# Patient Record
Sex: Male | Born: 1995 | Hispanic: No | Marital: Single | Smoking: Current every day smoker
Health system: Southern US, Community
[De-identification: ages and names within clinical notes are randomized; demographics above are authoritative.]

## PROBLEM LIST (undated history)

## (undated) DIAGNOSIS — E039 Hypothyroidism, unspecified: Secondary | ICD-10-CM

## (undated) DIAGNOSIS — E119 Type 2 diabetes mellitus without complications: Secondary | ICD-10-CM

## (undated) HISTORY — DX: Hypothyroidism, unspecified: E03.9

## (undated) HISTORY — DX: Type 2 diabetes mellitus without complications: E11.9

---
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
## 2017-11-17 ENCOUNTER — Other Ambulatory Visit: Payer: Self-pay | Admitting: Family

## 2019-12-26 ENCOUNTER — Inpatient Hospital Stay (HOSPITAL_COMMUNITY)
Admission: EM | Admit: 2019-12-26 | Discharge: 2019-12-29 | DRG: 919 | Disposition: A | Discharge status: Home / Self Care | Payer: 59 | Attending: Internal Medicine | Admitting: Internal Medicine

## 2019-12-26 ENCOUNTER — Encounter (HOSPITAL_COMMUNITY): Payer: Self-pay

## 2019-12-26 ENCOUNTER — Other Ambulatory Visit: Payer: Self-pay

## 2019-12-26 DIAGNOSIS — Z8249 Family history of ischemic heart disease and other diseases of the circulatory system: Secondary | ICD-10-CM

## 2019-12-26 DIAGNOSIS — Z833 Family history of diabetes mellitus: Secondary | ICD-10-CM

## 2019-12-26 DIAGNOSIS — T85694A Other mechanical complication of insulin pump, initial encounter: Secondary | ICD-10-CM | POA: Diagnosis not present

## 2019-12-26 DIAGNOSIS — E1010 Type 1 diabetes mellitus with ketoacidosis without coma: Secondary | ICD-10-CM | POA: Diagnosis present

## 2019-12-26 DIAGNOSIS — E063 Autoimmune thyroiditis: Secondary | ICD-10-CM | POA: Diagnosis present

## 2019-12-26 DIAGNOSIS — Z79890 Hormone replacement therapy (postmenopausal): Secondary | ICD-10-CM

## 2019-12-26 DIAGNOSIS — Y742 Prosthetic and other implants, materials and accessory general hospital and personal-use devices associated with adverse incidents: Secondary | ICD-10-CM | POA: Diagnosis present

## 2019-12-26 DIAGNOSIS — Z20822 Contact with and (suspected) exposure to covid-19: Secondary | ICD-10-CM | POA: Diagnosis present

## 2019-12-26 DIAGNOSIS — Z79891 Long term (current) use of opiate analgesic: Secondary | ICD-10-CM

## 2019-12-26 DIAGNOSIS — E1110 Type 2 diabetes mellitus with ketoacidosis without coma: Secondary | ICD-10-CM | POA: Diagnosis present

## 2019-12-26 DIAGNOSIS — Z9119 Patient's noncompliance with other medical treatment and regimen: Secondary | ICD-10-CM

## 2019-12-26 DIAGNOSIS — Z794 Long term (current) use of insulin: Secondary | ICD-10-CM

## 2019-12-26 DIAGNOSIS — Z791 Long term (current) use of non-steroidal anti-inflammatories (NSAID): Secondary | ICD-10-CM

## 2019-12-26 DIAGNOSIS — E875 Hyperkalemia: Secondary | ICD-10-CM | POA: Diagnosis present

## 2019-12-26 DIAGNOSIS — Z79899 Other long term (current) drug therapy: Secondary | ICD-10-CM

## 2019-12-26 DIAGNOSIS — K219 Gastro-esophageal reflux disease without esophagitis: Secondary | ICD-10-CM | POA: Diagnosis present

## 2019-12-26 DIAGNOSIS — R778 Other specified abnormalities of plasma proteins: Secondary | ICD-10-CM | POA: Diagnosis not present

## 2019-12-26 LAB — CBG MONITORING, ED
Glucose-Capillary: >600 mg/dL (ref 70–99)
Glucose-Capillary: >600 mg/dL (ref 70–99)

## 2019-12-26 LAB — COMPREHENSIVE METABOLIC PANEL
ALT: 44 U/L (ref 0–44)
AST: 66 U/L — ABNORMAL HIGH (ref 15–41)
Albumin: 5.3 g/dL — ABNORMAL HIGH (ref 3.5–5.0)
Alkaline Phosphatase: 100 U/L (ref 38–126)
BUN: 39 mg/dL — ABNORMAL HIGH (ref 6–20)
CO2: <7 mmol/L — ABNORMAL LOW (ref 22–32)
Calcium: 9 mg/dL (ref 8.9–10.3)
Chloride: 81 mmol/L — ABNORMAL LOW (ref 98–111)
Creatinine, Ser: 1.86 mg/dL — ABNORMAL HIGH (ref 0.61–1.24)
GFR calc Af Amer: 58 mL/min — ABNORMAL LOW (ref >60)
GFR calc non Af Amer: 50 mL/min — ABNORMAL LOW (ref >60)
Glucose, Bld: 724 mg/dL (ref 70–99)
Potassium: 5.3 mmol/L — ABNORMAL HIGH (ref 3.5–5.1)
Sodium: 120 mmol/L — ABNORMAL LOW (ref 135–145)
Total Bilirubin: 1.9 mg/dL — ABNORMAL HIGH (ref 0.3–1.2)
Total Protein: 9.9 g/dL — ABNORMAL HIGH (ref 6.5–8.1)

## 2019-12-26 LAB — I-STAT CHEM 8, ED
BUN: 58 mg/dL — ABNORMAL HIGH (ref 6–20)
Calcium, Ion: 1.08 mmol/L — ABNORMAL LOW (ref 1.15–1.40)
Chloride: 96 mmol/L — ABNORMAL LOW (ref 98–111)
Creatinine, Ser: 1.5 mg/dL — ABNORMAL HIGH (ref 0.61–1.24)
Glucose, Bld: >700 mg/dL (ref 70–99)
HCT: 51 % (ref 39.0–52.0)
Hemoglobin: 17.3 g/dL — ABNORMAL HIGH (ref 13.0–17.0)
Potassium: 6.2 mmol/L — ABNORMAL HIGH (ref 3.5–5.1)
Sodium: 120 mmol/L — ABNORMAL LOW (ref 135–145)
TCO2: 6 mmol/L — ABNORMAL LOW (ref 22–32)

## 2019-12-26 LAB — CBC WITH DIFFERENTIAL/PLATELET
Abs Immature Granulocytes: 0 10*3/uL (ref 0.00–0.07)
Band Neutrophils: 4 %
Basophils Absolute: 0 10*3/uL (ref 0.0–0.1)
Basophils Relative: 0 %
Eosinophils Absolute: 0 10*3/uL (ref 0.0–0.5)
Eosinophils Relative: 0 %
HCT: 50.5 % (ref 39.0–52.0)
Hemoglobin: 17.1 g/dL — ABNORMAL HIGH (ref 13.0–17.0)
Lymphocytes Relative: 13 %
Lymphs Abs: 4 10*3/uL (ref 0.7–4.0)
MCH: 32.3 pg (ref 26.0–34.0)
MCHC: 33.9 g/dL (ref 30.0–36.0)
MCV: 95.3 fL (ref 80.0–100.0)
Monocytes Absolute: 1.8 10*3/uL — ABNORMAL HIGH (ref 0.1–1.0)
Monocytes Relative: 6 %
Neutro Abs: 24.9 10*3/uL — ABNORMAL HIGH (ref 1.7–7.7)
Neutrophils Relative %: 77 %
Platelets: 554 10*3/uL — ABNORMAL HIGH (ref 150–400)
RBC: 5.3 MIL/uL (ref 4.22–5.81)
RDW: 12.1 % (ref 11.5–15.5)
WBC: 30.7 10*3/uL — ABNORMAL HIGH (ref 4.0–10.5)
nRBC: 0 % (ref 0.0–0.2)

## 2019-12-26 LAB — BLOOD GAS, VENOUS
Bicarbonate: 4.9 mmol/L — ABNORMAL LOW (ref 20.0–28.0)
O2 Saturation: 74 %
Patient temperature: 98.6
pCO2, Ven: 26 mmHg — ABNORMAL LOW (ref 44.0–60.0)
pH, Ven: 6.907 — CL (ref 7.250–7.430)
pO2, Ven: 52.2 mmHg — ABNORMAL HIGH (ref 32.0–45.0)

## 2019-12-26 LAB — LIPASE, BLOOD: Lipase: 23 U/L (ref 11–51)

## 2019-12-26 LAB — ETHANOL: Alcohol, Ethyl (B): <10 mg/dL (ref <10)

## 2019-12-26 MED ORDER — SODIUM CHLORIDE 0.9 % IV SOLN: INTRAVENOUS | Status: DC

## 2019-12-26 MED ORDER — INSULIN REGULAR(HUMAN) IN NACL 100-0.9 UT/100ML-% IV SOLN
INTRAVENOUS | Status: DC
  Administered 2019-12-26: 8.5 [IU]/h via INTRAVENOUS
  Administered 2019-12-27: 8 [IU]/h via INTRAVENOUS
  Administered 2019-12-28: 14:00:00 0.8 [IU]/h via INTRAVENOUS
  Filled 2019-12-26 (×2): qty 100

## 2019-12-26 MED ORDER — LACTATED RINGERS IV BOLUS
1000.0000 mL | Freq: Once | INTRAVENOUS | Status: AC
  Administered 2019-12-26: 1000 mL via INTRAVENOUS

## 2019-12-26 MED ORDER — SODIUM BICARBONATE 8.4 % IV SOLN
INTRAVENOUS | Status: DC
  Filled 2019-12-26: qty 100

## 2019-12-26 MED ORDER — SODIUM CHLORIDE 0.9 % IV BOLUS
1000.0000 mL | Freq: Once | INTRAVENOUS | Status: AC
  Administered 2019-12-26: 22:00:00 1000 mL via INTRAVENOUS

## 2019-12-26 MED ORDER — DEXTROSE-NACL 5-0.45 % IV SOLN: INTRAVENOUS | Status: DC

## 2019-12-26 MED ORDER — ONDANSETRON HCL 4 MG/2ML IJ SOLN
4.0000 mg | Freq: Once | INTRAMUSCULAR | Status: AC
  Administered 2019-12-26: 4 mg via INTRAVENOUS
  Filled 2019-12-26: qty 2

## 2019-12-26 MED ORDER — LACTATED RINGERS IV BOLUS: 20.0000 mL/kg | Freq: Once | INTRAVENOUS | Status: DC

## 2019-12-26 MED ORDER — DEXTROSE 50 % IV SOLN: 0.0000 mL | INTRAVENOUS | Status: DC | PRN

## 2019-12-26 NOTE — Consult Note (Signed)
NAME:  Blake Sherman, MRN:  [id number], DOB:  10-24-1996, LOS: 0 ADMISSION DATE:  12/26/2019, CONSULTATION DATE:  3/2 REFERRING MD:  Dr. Jeraldine Loots EDP, CHIEF COMPLAINT:  DKA   Brief History   24 year old male who is a known diabetic admitted 3/2 with DKA  History of present illness   24 year old male with PMH as below, which is significant for diabetes type 1 and hypothyroidism. He follows with endocrinology at [facility] Dr. Katrinka Blazing. He has traditionally been managed with Lantus and meal coverage, but has recently been training with an insulin pump. He tells me that he was using insulin pump, but it failed to communicate with his glucometer, and when that happened there were communication issues with his doctor so he decided to do nothing. He thinks it has been a couple weeks since his last dose of insulin.  He presented to [facility] ED with complaints of nausea/vomiting/malaise x3 days in the evening hours of 3/2. Given his history and CBG check above the high limit of 700 he was felt to likely have DKA. This was confirmed by VBG pH of 6.9 and bicarb on BMP of less than 7. He was started on insulin and bicarbonate infusion and PCCM was asked to admit.   Past Medical History   has a past medical history of Diabetes mellitus without complication (HCC) and Hypothyroidism.   Significant Hospital Events   3/2 admit  Consults:    Procedures:    Significant Diagnostic Tests:    Micro Data:    Antimicrobials:     Interim history/subjective:  Not feeling much better. C/o very dry mouth.   Objective   Blood pressure (!) 131/105, pulse (!) 105, temperature (!) 97.5 F (36.4 C), temperature source Axillary, resp. rate (!) 40, SpO2 100 %.       No intake or output data in the 24 hours ending 12/26/19 2348 There were no vitals filed for this visit.  Examination: General: Young adult thin male HENT: [place]/AT, PERRL, No JVD Lungs: Clear bilateral breath sounds. Deep rapid  respirations.  Cardiovascular: Rate 101. Regular. No MRG Abdomen: Soft, non-tender Extremities: No acute deformity Neuro: Somnolent, but arouses to voice and answers questions appropriately.   Resolved Hospital Problem list     Assessment & Plan:   Diabetic ketoacidosis: known diabetic who has been describe and "uncontrolled" in previous office visits. pH on admission 6.9. Sister tells me he changed insurances and it interrupted the flow of his prescriptions.  - admit to ICU.  - Gratuitous IVF resuscitation.  - Insulin infusion per DKA protocol - Bicarbonate infusion until serum Bicarb in reportable range then stop. Prolonged bicarb infusion in setting of DKA can prolong time for anion gap to close - Frequent BMP - CBG hourly - Follows with [facility] endocrinology. Tells me he has switched doctors, but does not know new doctors name.    Hyperkalemia Pseudohyponatremia (corrects to 135) - monitor with serial BMP - Hydrate  Hypothyroidism - TSH and Free T4 - Continue home synthroid once able to take PO.   Best practice:  Diet: NPO Pain/Anxiety/Delirium protocol (if indicated): NA VAP protocol (if indicated): NA DVT prophylaxis: SQH GI prophylaxis: NA Glucose control: DKA protocol Mobility: BR Code Status: FULL Family Communication: Sister updated. I called mother, who put me on with his sister for language barrier reasons. His sister is very informed regarding his DM care.  Disposition: ICU  Labs   CBC: Recent Labs  Lab 12/26/19 2152 12/26/19 2218  WBC 30.7*  --   NEUTROABS 24.9*  --   HGB 17.1* 17.3*  HCT 50.5 51.0  MCV 95.3  --   PLT 554*  --     Basic Metabolic Panel: Recent Labs  Lab 12/26/19 2152 12/26/19 2218  NA 120* 120*  K 5.3* 6.2*  CL 81* 96*  CO2 <7*  --   GLUCOSE 724* >700*  BUN 39* 58*  CREATININE 1.86* 1.50*  CALCIUM 9.0  --    GFR: CrCl cannot be calculated (Unknown ideal weight.). Recent Labs  Lab 12/26/19 2152  WBC 30.7*     Liver Function Tests: Recent Labs  Lab 12/26/19 2152  AST 66*  ALT 44  ALKPHOS 100  BILITOT 1.9*  PROT 9.9*  ALBUMIN 5.3*   Recent Labs  Lab 12/26/19 2152  LIPASE 23   No results for input(s): AMMONIA in the last 168 hours.  ABG    Component Value Date/Time   HCO3 4.9 (L) 12/26/2019 2152   TCO2 6 (L) 12/26/2019 2218   O2SAT 74.0 12/26/2019 2152     Coagulation Profile: No results for input(s): INR, PROTIME in the last 168 hours.  Cardiac Enzymes: No results for input(s): CKTOTAL, CKMB, CKMBINDEX, TROPONINI in the last 168 hours.  HbA1C: Hemoglobin A1C  Date/Time Value Ref Range Status  09/18/2015 09:22 AM 10.1  Final  03/06/2015 03:15 PM 9.5  Final   Hgb A1c MFr Bld  Date/Time Value Ref Range Status  03/06/2015 03:33 PM 10.6 (H) <5.7 % Final    Comment:                                                                           According to the ADA Clinical Practice Recommendations for 2011, when HbA1c is used as a screening test:     >=6.5%   Diagnostic of Diabetes Mellitus            (if abnormal result is confirmed)   5.7-6.4%   Increased risk of developing Diabetes Mellitus   References:Diagnosis and Classification of Diabetes Mellitus,Diabetes Care,2011,34(Suppl 1):S62-S69 and Standards of Medical Care in         Diabetes - 2011,Diabetes Care,2011,34 (Suppl 1):S11-S61.     04/04/2014 03:20 PM 8.7 (H) <5.7 % Final    Comment:                                                                           According to the ADA Clinical Practice Recommendations for 2011, when HbA1c is used as a screening test:     >=6.5%   Diagnostic of Diabetes Mellitus            (if abnormal result is confirmed)   5.7-6.4%   Increased risk of developing Diabetes Mellitus   References:Diagnosis and Classification of Diabetes Mellitus,Diabetes Care,2011,34(Suppl 1):S62-S69 and Standards of Medical Care in         Diabetes -  2011,Diabetes Care,2011,34 (Suppl  1):S11-S61.      CBG: Recent Labs  Lab 12/26/19 2138 12/26/19 2317  GLUCAP >600* >600*    Review of Systems:   Bolds are positive  Constitutional: weight loss, gain, night sweats, Fevers, chills, fatigue .  HEENT: headaches, Sore throat, sneezing, nasal congestion, post nasal drip, Difficulty swallowing, Tooth/dental problems, visual complaints visual changes, ear ache CV:  chest pain, radiates:,Orthopnea, PND, swelling in lower extremities, dizziness, palpitations, syncope.  GI  heartburn, indigestion, abdominal pain, nausea, vomiting, diarrhea, change in bowel habits, loss of appetite, bloody stools.  Resp: cough, productive:, hemoptysis, dyspnea, chest pain, pleuritic.  Skin: rash or itching or icterus GU: dysuria, change in color of urine, urgency or frequency. flank pain, hematuria  MS: joint pain or swelling. decreased range of motion  Psych: change in mood or affect. depression or anxiety.  Neuro: difficulty with speech, weakness, numbness, ataxia    Past Medical History  He,  has a past medical history of Diabetes mellitus without complication (HCC) and Hypothyroidism.   Surgical History   History reviewed. No pertinent surgical history.   Social History   reports that he has been smoking. He has never used smokeless tobacco. He reports current drug use. Drug: Marijuana. He reports that he does not drink alcohol.   Family History   His family history includes Anemia in his mother and sister; Diabetes in his father and paternal grandmother; Hypertension in his father.   Allergies No Known Allergies   Home Medications  Prior to Admission medications   Medication Sig Start Date End Date Taking? Authorizing Provider  glucagon 1 MG injection Use for Severe Hypoglycemia . Inject 1 mg intramuscularly if unresponsive, unable to swallow, unconscious and/or has seizure 09/18/15  Yes Beasley, Spenser, NP  LANTUS SOLOSTAR 100 UNIT/ML Solostar Pen INJECT UP TO 50 UNITS UNDER  THE SKIN DAILY Patient taking differently: Inject 50 Units into the skin daily.  02/18/16  Yes Gretchen Short, NP  ACCU-CHEK FASTCLIX LANCETS MISC USE TO TEST BLOOD SUGAR SEVEN TIMES EVERY DAY 09/23/16   Georges Mouse, NP  ACCU-CHEK SMARTVIEW test strip USE AS DIRECTED TO CHEEK BLOOD SUGAR SIX TIMES DAILY 02/13/16   Dessa Phi, MD  acetone, urine, test strip Check ketones per protocol 05/22/13   Saverio Danker, MD  BD PEN NEEDLE NANO U/F 32G X 4 MM MISC USE TO INJECT INSULIN SIX TIMES DAILY 09/23/16   Dessa Phi, MD  HYDROcodone-acetaminophen (NORCO/VICODIN) 5-325 MG tablet Take 1 tablet by mouth every 4 (four) hours as needed. Patient not taking: Reported on 12/26/2019 12/30/15   Sam, Ace Gins, PA-C  ibuprofen (ADVIL,MOTRIN) 800 MG tablet Take 1 tablet (800 mg total) by mouth 3 (three) times daily. Patient not taking: Reported on 12/26/2019 12/30/15   Sam, Ace Gins, PA-C  levothyroxine (SYNTHROID, LEVOTHROID) 125 MCG tablet TAKE 1 TABLET BY MOUTH EVERY MORNING BEFORE BREAKFAST Patient not taking: Reported on 12/26/2019 02/13/16   Verneda Skill, FNP  methocarbamol (ROBAXIN) 500 MG tablet Take 1 tablet (500 mg total) by mouth 2 (two) times daily. Patient not taking: Reported on 12/26/2019 12/30/15   Sam, Ace Gins, PA-C  NOVOLOG FLEXPEN 100 UNIT/ML FlexPen INJECT UP TO 50 UNITS UNDER THE SKIN DAILY Patient not taking: Reported on 12/26/2019 02/18/16   Gretchen Short, NP     Critical care time: 45 mins       Joneen Roach, AGACNP-BC [facility]  See Amion for personal pager PCCM on call pager [phone])  [phone]  12/27/2019 1:04 AM

## 2019-12-26 NOTE — ED Notes (Signed)
EDP Blake Sherman made aware of high CBG

## 2019-12-26 NOTE — ED Triage Notes (Signed)
Patient arrived stating for three days he has had abdominal pain, nausea and vomiting. Reports his blood sugar has been reading around 450. Last dose of insulin yesterday afternoon.

## 2019-12-26 NOTE — ED Provider Notes (Signed)
[facility]-EMERGENCY DEPT Provider Note   CSN: [id number] Arrival date & time: 12/26/19  2118     History Chief Complaint  Patient presents with  . Hyperglycemia    Blake Sherman is a 24 y.o. male.  HPI    Patient with diabetes presents with hyperglycemia, nausea, vomiting, abdominal discomfort.It is unclear if he has been taking his medication regularly, but over the past 3 days he has developed weakness, nausea, vomiting.  No clear relief with anything, and today, with concern for glucose readings greater than 450, ongoing discomfort he presents for evaluation.     Past Medical History:  Diagnosis Date  . Diabetes mellitus without complication [facility])    diagnosed July 2014  . Hypothyroidism     Patient Active Problem List   Diagnosis Date Noted  . 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 Thyroiditis, autoimmune 06/28/2013  . Goiter 06/28/2013  . Hypoglycemia associated with diabetes (HCC) 06/28/2013  . Diabetes mellitus, insulin dependent (IDDM), uncontrolled 05/20/2013    History reviewed. No pertinent surgical history.     Family History  Problem Relation Age of Onset  . Anemia Mother   . Diabetes Father   . Hypertension Father   . Anemia Sister   . Diabetes Paternal Grandmother     Social History   Tobacco Use  . Smoking status: Current Every Day Smoker  . Smokeless tobacco: Never Used  Substance Use Topics  . Alcohol use: No  . Drug use: Yes    Types: Marijuana    Home Medications Prior to Admission medications   Medication Sig Start Date End Date Taking? Authorizing Provider  glucagon 1 MG injection Use for Severe Hypoglycemia . Inject 1 mg intramuscularly if unresponsive, unable to swallow, unconscious and/or has seizure 09/18/15  Yes Beasley, Spenser, NP  LANTUS SOLOSTAR 100 UNIT/ML Solostar Pen INJECT UP TO 50 UNITS UNDER THE SKIN DAILY Patient taking differently: Inject 50 Units into the skin daily.   02/18/16  Yes Gretchen Short, NP  ACCU-CHEK FASTCLIX LANCETS MISC USE TO TEST BLOOD SUGAR SEVEN TIMES EVERY DAY 09/23/16   Georges Mouse, NP  ACCU-CHEK SMARTVIEW test strip USE AS DIRECTED TO CHEEK BLOOD SUGAR SIX TIMES DAILY 02/13/16   Dessa Phi, MD  acetone, urine, test strip Check ketones per protocol 05/22/13   Saverio Danker, MD  BD PEN NEEDLE NANO U/F 32G X 4 MM MISC USE TO INJECT INSULIN SIX TIMES DAILY 09/23/16   Dessa Phi, MD  HYDROcodone-acetaminophen (NORCO/VICODIN) 5-325 MG tablet Take 1 tablet by mouth every 4 (four) hours as needed. Patient not taking: Reported on 12/26/2019 12/30/15   Sam, Ace Gins, PA-C  ibuprofen (ADVIL,MOTRIN) 800 MG tablet Take 1 tablet (800 mg total) by mouth 3 (three) times daily. Patient not taking: Reported on 12/26/2019 12/30/15   Sam, Ace Gins, PA-C  levothyroxine (SYNTHROID, LEVOTHROID) 125 MCG tablet TAKE 1 TABLET BY MOUTH EVERY MORNING BEFORE BREAKFAST Patient not taking: Reported on 12/26/2019 02/13/16   Verneda Skill, FNP  methocarbamol (ROBAXIN) 500 MG tablet Take 1 tablet (500 mg total) by mouth 2 (two) times daily. Patient not taking: Reported on 12/26/2019 12/30/15   Sam, Ace Gins, PA-C  NOVOLOG FLEXPEN 100 UNIT/ML FlexPen INJECT UP TO 50 UNITS UNDER THE SKIN DAILY Patient not taking: Reported on 12/26/2019 02/18/16   Gretchen Short, NP    Allergies    Patient has no known allergies.  Review of Systems   Review of Systems  Constitutional:  Per HPI, otherwise negative  HENT:       Per HPI, otherwise negative  Respiratory:       Per HPI, otherwise negative  Cardiovascular:       Per HPI, otherwise negative  Gastrointestinal: Positive for abdominal pain, nausea and vomiting.  Endocrine:       Negative aside from HPI  Genitourinary:       Neg aside from HPI   Musculoskeletal:       Per HPI, otherwise negative  Skin: Negative.   Neurological: Positive for weakness. Negative for syncope.    Physical Exam Updated Vital  Signs BP (!) 136/98 (BP Location: Right Arm)   Pulse (!) 115   Temp (!) 97.5 F (36.4 C) (Axillary)   Resp 17   SpO2 100%   Physical Exam Vitals and nursing note reviewed.  Constitutional:      Appearance: He is well-developed. He is ill-appearing and diaphoretic.  HENT:     Head: Normocephalic and atraumatic.  Eyes:     Conjunctiva/sclera: Conjunctivae normal.  Cardiovascular:     Rate and Rhythm: Regular rhythm. Tachycardia present.  Pulmonary:     Effort: Tachypnea present. No respiratory distress.     Breath sounds: No stridor.  Abdominal:     General: There is no distension.     Tenderness: There is no abdominal tenderness.     Comments: No guarding, mild diffuse discomfort with palpation  Skin:    General: Skin is warm.  Neurological:     Mental Status: He is alert and oriented to person, place, and time.  Psychiatric:        Mood and Affect: Mood is anxious.     ED Results / Procedures / Treatments   Labs (all labs ordered are listed, but only abnormal results are displayed) Labs Reviewed  COMPREHENSIVE METABOLIC PANEL - Abnormal; Notable for the following components:      Result Value   Sodium 120 (*)    Potassium 5.3 (*)    Chloride 81 (*)    CO2 <7 (*)    Glucose, Bld 724 (*)    BUN 39 (*)    Creatinine, Ser 1.86 (*)    Total Protein 9.9 (*)    Albumin 5.3 (*)    AST 66 (*)    Total Bilirubin 1.9 (*)    GFR calc non Af Amer 50 (*)    GFR calc Af Amer 58 (*)    All other components within normal limits  CBC WITH DIFFERENTIAL/PLATELET - Abnormal; Notable for the following components:   WBC 30.7 (*)    Hemoglobin 17.1 (*)    Platelets 554 (*)    Neutro Abs 24.9 (*)    Monocytes Absolute 1.8 (*)    All other components within normal limits  BLOOD GAS, VENOUS - Abnormal; Notable for the following components:   pH, Ven 6.907 (*)    pCO2, Ven 26.0 (*)    pO2, Ven 52.2 (*)    Bicarbonate 4.9 (*)    All other components within normal limits  I-STAT  CHEM 8, ED - Abnormal; Notable for the following components:   Sodium 120 (*)    Potassium 6.2 (*)    Chloride 96 (*)    BUN 58 (*)    Creatinine, Ser 1.50 (*)    Glucose, Bld >700 (*)    Calcium, Ion 1.08 (*)    TCO2 6 (*)    Hemoglobin 17.3 (*)    All other  components within normal limits  CBG MONITORING, ED - Abnormal; Notable for the following components:   Glucose-Capillary >600 (*)    All other components within normal limits  CBG MONITORING, ED - Abnormal; Notable for the following components:   Glucose-Capillary >600 (*)    All other components within normal limits  SARS CORONAVIRUS 2 (TAT 6-24 HRS)  ETHANOL  LIPASE, BLOOD  URINALYSIS, ROUTINE W REFLEX MICROSCOPIC  BETA-HYDROXYBUTYRIC ACID  BETA-HYDROXYBUTYRIC ACID  POC SARS CORONAVIRUS 2 AG -  ED    Procedures Procedures (including critical care time)  CRITICAL CARE Performed by: Carmin Muskrat Total critical care time: 45 minutes Critical care time was exclusive of separately billable procedures and treating other patients. Critical care was necessary to treat or prevent imminent or life-threatening deterioration. Critical care was time spent personally by me on the following activities: development of treatment plan with patient and/or surrogate as well as nursing, discussions with consultants, evaluation of patient's response to treatment, examination of patient, obtaining history from patient or surrogate, ordering and performing treatments and interventions, ordering and review of laboratory studies, ordering and review of radiographic studies, pulse oximetry and re-evaluation of patient's condition.   Medications Ordered in ED Medications  insulin regular, human (MYXREDLIN) 100 units/ 100 mL infusion (6.5 Units/hr Intravenous New Bag/Given 12/26/19 2323)  0.9 %  sodium chloride infusion (has no administration in time range)  dextrose 5 %-0.45 % sodium chloride infusion ( Intravenous Stopped 12/26/19 2324)   dextrose 50 % solution 0-50 mL (has no administration in time range)  lactated ringers bolus 20 mL/kg (has no administration in time range)  sodium bicarbonate 100 mEq in dextrose 5 % 1,000 mL infusion (has no administration in time range)  lactated ringers bolus 1,000 mL (has no administration in time range)  lactated ringers bolus 1,000 mL (has no administration in time range)  sodium chloride 0.9 % bolus 1,000 mL (1,000 mLs Intravenous New Bag/Given (Non-Interop) 12/26/19 2200)  ondansetron (ZOFRAN) injection 4 mg (4 mg Intravenous Given 12/26/19 2258)    ED Course  I have reviewed the triage vital signs and the nursing notes.  Pertinent labs & imaging results that were available during my care of the patient were reviewed by me and considered in my medical decision making (see chart for details).  With concern for DKA after the initial evaluation given the patient's history, glucose, he received empiric fluids, Zofran, labs.   Update: Initial labs notable for glucose greater than 700, potassium 6.2.  Update:, Additional labs notable for pH 6.9. On repeat exam the patient is in similar condition, tachypneic, though sitting upright, requesting fluids. He is aware of findings, need for admission, ongoing fluids, insulin drip.  11:29 PM Patient in similar condition, tachypneic.  Additional labs now consistent with DKA with anion gap greater than 30, severe acidosis. Patient has started insulin drip, will receive bicarbonate drip.  I discussed this case with our critical care colleagues for admission to the ICU given his critical illness. Final Clinical Impression(s) / ED Diagnoses Final diagnoses:  Diabetic ketoacidosis without coma associated with type 1 diabetes mellitus ([place])     Carmin Muskrat, MD 12/26/19 2330

## 2019-12-26 NOTE — ED Notes (Addendum)
Date and time results received: 12/26/19 2225 (use smartphrase ".now" to insert current time)  Test: ph Critical Value: 6.907  Name of Provider Notified:  Dr. Jeraldine Loots  Orders Received? Or Actions Taken?: no new orders

## 2019-12-27 ENCOUNTER — Encounter (HOSPITAL_COMMUNITY): Payer: Self-pay | Admitting: Critical Care Medicine

## 2019-12-27 DIAGNOSIS — E063 Autoimmune thyroiditis: Secondary | ICD-10-CM | POA: Diagnosis not present

## 2019-12-27 DIAGNOSIS — I361 Nonrheumatic tricuspid (valve) insufficiency: Secondary | ICD-10-CM | POA: Diagnosis not present

## 2019-12-27 DIAGNOSIS — E1110 Type 2 diabetes mellitus with ketoacidosis without coma: Secondary | ICD-10-CM | POA: Diagnosis present

## 2019-12-27 DIAGNOSIS — Z20822 Contact with and (suspected) exposure to covid-19: Secondary | ICD-10-CM | POA: Diagnosis not present

## 2019-12-27 DIAGNOSIS — Z8249 Family history of ischemic heart disease and other diseases of the circulatory system: Secondary | ICD-10-CM | POA: Diagnosis not present

## 2019-12-27 DIAGNOSIS — Z79890 Hormone replacement therapy (postmenopausal): Secondary | ICD-10-CM | POA: Diagnosis not present

## 2019-12-27 DIAGNOSIS — R0602 Shortness of breath: Secondary | ICD-10-CM | POA: Diagnosis not present

## 2019-12-27 DIAGNOSIS — Z791 Long term (current) use of non-steroidal anti-inflammatories (NSAID): Secondary | ICD-10-CM | POA: Diagnosis not present

## 2019-12-27 DIAGNOSIS — E875 Hyperkalemia: Secondary | ICD-10-CM | POA: Diagnosis not present

## 2019-12-27 DIAGNOSIS — Z79891 Long term (current) use of opiate analgesic: Secondary | ICD-10-CM | POA: Diagnosis not present

## 2019-12-27 DIAGNOSIS — E1010 Type 1 diabetes mellitus with ketoacidosis without coma: Secondary | ICD-10-CM | POA: Diagnosis not present

## 2019-12-27 DIAGNOSIS — K219 Gastro-esophageal reflux disease without esophagitis: Secondary | ICD-10-CM | POA: Diagnosis present

## 2019-12-27 DIAGNOSIS — R079 Chest pain, unspecified: Secondary | ICD-10-CM | POA: Diagnosis not present

## 2019-12-27 DIAGNOSIS — Z794 Long term (current) use of insulin: Secondary | ICD-10-CM | POA: Diagnosis not present

## 2019-12-27 DIAGNOSIS — T85694A Other mechanical complication of insulin pump, initial encounter: Secondary | ICD-10-CM | POA: Diagnosis not present

## 2019-12-27 DIAGNOSIS — Z79899 Other long term (current) drug therapy: Secondary | ICD-10-CM | POA: Diagnosis not present

## 2019-12-27 DIAGNOSIS — Y742 Prosthetic and other implants, materials and accessory general hospital and personal-use devices associated with adverse incidents: Secondary | ICD-10-CM | POA: Diagnosis present

## 2019-12-27 DIAGNOSIS — E0910 Drug or chemical induced diabetes mellitus with ketoacidosis without coma: Secondary | ICD-10-CM | POA: Diagnosis not present

## 2019-12-27 DIAGNOSIS — Z833 Family history of diabetes mellitus: Secondary | ICD-10-CM | POA: Diagnosis not present

## 2019-12-27 DIAGNOSIS — R778 Other specified abnormalities of plasma proteins: Secondary | ICD-10-CM | POA: Diagnosis not present

## 2019-12-27 DIAGNOSIS — Z9119 Patient's noncompliance with other medical treatment and regimen: Secondary | ICD-10-CM | POA: Diagnosis not present

## 2019-12-27 LAB — BASIC METABOLIC PANEL
Anion gap: 23 — ABNORMAL HIGH (ref 5–15)
Anion gap: 16 — ABNORMAL HIGH (ref 5–15)
Anion gap: 9 (ref 5–15)
Anion gap: 11 (ref 5–15)
Anion gap: 14 (ref 5–15)
BUN: 33 mg/dL — ABNORMAL HIGH (ref 6–20)
BUN: 26 mg/dL — ABNORMAL HIGH (ref 6–20)
BUN: 22 mg/dL — ABNORMAL HIGH (ref 6–20)
BUN: 18 mg/dL (ref 6–20)
BUN: 17 mg/dL (ref 6–20)
CO2: 7 mmol/L — ABNORMAL LOW (ref 22–32)
CO2: 13 mmol/L — ABNORMAL LOW (ref 22–32)
CO2: 20 mmol/L — ABNORMAL LOW (ref 22–32)
CO2: 23 mmol/L (ref 22–32)
CO2: 21 mmol/L — ABNORMAL LOW (ref 22–32)
Calcium: 8 mg/dL — ABNORMAL LOW (ref 8.9–10.3)
Calcium: 7.9 mg/dL — ABNORMAL LOW (ref 8.9–10.3)
Calcium: 8 mg/dL — ABNORMAL LOW (ref 8.9–10.3)
Calcium: 8.1 mg/dL — ABNORMAL LOW (ref 8.9–10.3)
Calcium: 8.2 mg/dL — ABNORMAL LOW (ref 8.9–10.3)
Chloride: 96 mmol/L — ABNORMAL LOW (ref 98–111)
Chloride: 100 mmol/L (ref 98–111)
Chloride: 101 mmol/L (ref 98–111)
Chloride: 98 mmol/L (ref 98–111)
Chloride: 96 mmol/L — ABNORMAL LOW (ref 98–111)
Creatinine, Ser: 1.42 mg/dL — ABNORMAL HIGH (ref 0.61–1.24)
Creatinine, Ser: 1.14 mg/dL (ref 0.61–1.24)
Creatinine, Ser: 0.87 mg/dL (ref 0.61–1.24)
Creatinine, Ser: 0.84 mg/dL (ref 0.61–1.24)
Creatinine, Ser: 0.84 mg/dL (ref 0.61–1.24)
GFR calc Af Amer: >60 mL/min (ref >60)
GFR calc Af Amer: >60 mL/min (ref >60)
GFR calc Af Amer: >60 mL/min (ref >60)
GFR calc Af Amer: >60 mL/min (ref >60)
GFR calc Af Amer: >60 mL/min (ref >60)
GFR calc non Af Amer: >60 mL/min (ref >60)
GFR calc non Af Amer: >60 mL/min (ref >60)
GFR calc non Af Amer: >60 mL/min (ref >60)
GFR calc non Af Amer: >60 mL/min (ref >60)
GFR calc non Af Amer: >60 mL/min (ref >60)
Glucose, Bld: 445 mg/dL — ABNORMAL HIGH (ref 70–99)
Glucose, Bld: 291 mg/dL — ABNORMAL HIGH (ref 70–99)
Glucose, Bld: 193 mg/dL — ABNORMAL HIGH (ref 70–99)
Glucose, Bld: 144 mg/dL — ABNORMAL HIGH (ref 70–99)
Glucose, Bld: 215 mg/dL — ABNORMAL HIGH (ref 70–99)
Potassium: 4.5 mmol/L (ref 3.5–5.1)
Potassium: 4 mmol/L (ref 3.5–5.1)
Potassium: 3.7 mmol/L (ref 3.5–5.1)
Potassium: 3.4 mmol/L — ABNORMAL LOW (ref 3.5–5.1)
Potassium: 3.8 mmol/L (ref 3.5–5.1)
Sodium: 126 mmol/L — ABNORMAL LOW (ref 135–145)
Sodium: 129 mmol/L — ABNORMAL LOW (ref 135–145)
Sodium: 130 mmol/L — ABNORMAL LOW (ref 135–145)
Sodium: 132 mmol/L — ABNORMAL LOW (ref 135–145)
Sodium: 131 mmol/L — ABNORMAL LOW (ref 135–145)

## 2019-12-27 LAB — GLUCOSE, CAPILLARY
Glucose-Capillary: 526 mg/dL (ref 70–99)
Glucose-Capillary: 350 mg/dL — ABNORMAL HIGH (ref 70–99)
Glucose-Capillary: 347 mg/dL — ABNORMAL HIGH (ref 70–99)
Glucose-Capillary: 301 mg/dL — ABNORMAL HIGH (ref 70–99)
Glucose-Capillary: 323 mg/dL — ABNORMAL HIGH (ref 70–99)
Glucose-Capillary: 263 mg/dL — ABNORMAL HIGH (ref 70–99)
Glucose-Capillary: 221 mg/dL — ABNORMAL HIGH (ref 70–99)
Glucose-Capillary: 256 mg/dL — ABNORMAL HIGH (ref 70–99)
Glucose-Capillary: 139 mg/dL — ABNORMAL HIGH (ref 70–99)
Glucose-Capillary: 184 mg/dL — ABNORMAL HIGH (ref 70–99)
Glucose-Capillary: 178 mg/dL — ABNORMAL HIGH (ref 70–99)
Glucose-Capillary: 153 mg/dL — ABNORMAL HIGH (ref 70–99)
Glucose-Capillary: 173 mg/dL — ABNORMAL HIGH (ref 70–99)
Glucose-Capillary: 145 mg/dL — ABNORMAL HIGH (ref 70–99)
Glucose-Capillary: 152 mg/dL — ABNORMAL HIGH (ref 70–99)
Glucose-Capillary: 355 mg/dL — ABNORMAL HIGH (ref 70–99)
Glucose-Capillary: 274 mg/dL — ABNORMAL HIGH (ref 70–99)
Glucose-Capillary: 317 mg/dL — ABNORMAL HIGH (ref 70–99)
Glucose-Capillary: 258 mg/dL — ABNORMAL HIGH (ref 70–99)

## 2019-12-27 LAB — URINALYSIS, ROUTINE W REFLEX MICROSCOPIC
Bacteria, UA: NONE SEEN
Bilirubin Urine: NEGATIVE
Glucose, UA: >=500 mg/dL — AB
Ketones, ur: 80 mg/dL — AB
Leukocytes,Ua: NEGATIVE
Nitrite: NEGATIVE
Protein, ur: 30 mg/dL — AB
Specific Gravity, Urine: 1.022 (ref 1.005–1.030)
pH: 6 (ref 5.0–8.0)

## 2019-12-27 LAB — HEMOGLOBIN A1C
Hgb A1c MFr Bld: 10.5 % — ABNORMAL HIGH (ref 4.8–5.6)
Mean Plasma Glucose: 254.65 mg/dL

## 2019-12-27 LAB — BLOOD GAS, VENOUS
Acid-base deficit: 13.6 mmol/L — ABNORMAL HIGH (ref 0.0–2.0)
Bicarbonate: 11.8 mmol/L — ABNORMAL LOW (ref 20.0–28.0)
O2 Saturation: 99.6 %
Patient temperature: 98.6
pCO2, Ven: 26.6 mmHg — ABNORMAL LOW (ref 44.0–60.0)
pH, Ven: 7.269 (ref 7.250–7.430)
pO2, Ven: 205 mmHg — ABNORMAL HIGH (ref 32.0–45.0)

## 2019-12-27 LAB — TSH: TSH: 94.291 u[IU]/mL — ABNORMAL HIGH (ref 0.350–4.500)

## 2019-12-27 LAB — BETA-HYDROXYBUTYRIC ACID
Beta-Hydroxybutyric Acid: >8 mmol/L — ABNORMAL HIGH (ref 0.05–0.27)
Beta-Hydroxybutyric Acid: 5.31 mmol/L — ABNORMAL HIGH (ref 0.05–0.27)
Beta-Hydroxybutyric Acid: 1.95 mmol/L — ABNORMAL HIGH (ref 0.05–0.27)

## 2019-12-27 LAB — MRSA PCR SCREENING: MRSA by PCR: POSITIVE — AB

## 2019-12-27 LAB — CBG MONITORING, ED
Glucose-Capillary: 525 mg/dL (ref 70–99)
Glucose-Capillary: >600 mg/dL (ref 70–99)

## 2019-12-27 LAB — SARS CORONAVIRUS 2 (TAT 6-24 HRS): SARS Coronavirus 2: NEGATIVE

## 2019-12-27 LAB — T4, FREE: Free T4: 0.26 ng/dL — ABNORMAL LOW (ref 0.61–1.12)

## 2019-12-27 LAB — MAGNESIUM: Magnesium: 2 mg/dL (ref 1.7–2.4)

## 2019-12-27 LAB — HIV ANTIBODY (ROUTINE TESTING W REFLEX): HIV Screen 4th Generation wRfx: NONREACTIVE

## 2019-12-27 LAB — PHOSPHORUS: Phosphorus: 2.4 mg/dL — ABNORMAL LOW (ref 2.5–4.6)

## 2019-12-27 MED ORDER — MUPIROCIN 2 % EX OINT
1.0000 "application " | TOPICAL_OINTMENT | Freq: Two times a day (BID) | CUTANEOUS | Status: DC
  Administered 2019-12-27 – 2019-12-29 (×5): 1 via NASAL
  Filled 2019-12-27 (×2): qty 22

## 2019-12-27 MED ORDER — HEPARIN SODIUM (PORCINE) 5000 UNIT/ML IJ SOLN
5000.0000 [IU] | Freq: Three times a day (TID) | INTRAMUSCULAR | Status: DC
  Administered 2019-12-27 – 2019-12-28 (×3): 5000 [IU] via SUBCUTANEOUS
  Filled 2019-12-27 (×2): qty 1

## 2019-12-27 MED ORDER — LACTATED RINGERS IV BOLUS
1000.0000 mL | INTRAVENOUS | Status: AC
  Administered 2019-12-27: 1000 mL via INTRAVENOUS

## 2019-12-27 MED ORDER — LEVOTHYROXINE SODIUM 100 MCG PO TABS: 100.0000 ug | ORAL_TABLET | Freq: Every day | ORAL | Status: DC

## 2019-12-27 MED ORDER — LEVOTHYROXINE SODIUM 25 MCG PO TABS
25.0000 ug | ORAL_TABLET | Freq: Every day | ORAL | Status: DC
  Administered 2019-12-27 – 2019-12-29 (×3): 25 ug via ORAL
  Filled 2019-12-27 (×3): qty 1

## 2019-12-27 MED ORDER — CHLORHEXIDINE GLUCONATE CLOTH 2 % EX PADS
6.0000 | MEDICATED_PAD | Freq: Every day | CUTANEOUS | Status: DC
  Administered 2019-12-27: 6 via TOPICAL

## 2019-12-27 MED ORDER — LEVOTHYROXINE SODIUM 100 MCG PO TABS
100.0000 ug | ORAL_TABLET | Freq: Every day | ORAL | Status: DC
  Administered 2019-12-27 – 2019-12-29 (×3): 100 ug via ORAL
  Filled 2019-12-27 (×3): qty 1

## 2019-12-27 MED ORDER — INSULIN PUMP
Freq: Three times a day (TID) | SUBCUTANEOUS | Status: DC
  Filled 2019-12-27: qty 1

## 2019-12-27 NOTE — Progress Notes (Signed)
Chart reviewed, met patient. Continue usual DKA care. Consider d/c bicarb soon  Erskine Emery MD PCCM

## 2019-12-27 NOTE — H&P (Signed)
Consult note by Mikey Bussing 12/27/19 is H+P.

## 2019-12-27 NOTE — Progress Notes (Signed)
Upon entering patient room to do his assessment patient stopped me and asked if I would check his blood sugar. I asked him if he felt like it was off and he said, "Yeah a little bit maybe." CBG reading was 355 at this time. I referred to the written orders and was instructed to call E link for any CBG over 300. Contacted E link nurse, and informed her of CBG and what the order instructs. I also saw that insulin pump needs and tubing was supposed to be changed prior to re-starting machine, patient claims neither were changed. I read the diabetic coordinators note and she mentions that the pump is working incorrectly. Labs show a gradual climb in anion gap over the last few hours. I was instructed by physician to discontinue the insulin pump and re-start insulin drip until the insulin pump can be troubleshot tomorrow. Will continue to assess patient CBG per endo tool criteria. BMP to be repeated at 0000.

## 2019-12-27 NOTE — Progress Notes (Signed)
eLink Physician-Brief Progress Note Patient Name: Blake Sherman DOB: 1996/05/05 MRN: [id number]   Date of Service  12/27/2019  HPI/Events of Note  The patient's insulin pump is malfunctioning. Blood glucose now back up to 355 and AG now = 14.   eICU Interventions  Will order: 1. Stop insulin pump. 2. Resume Endo Tool for glucose control.      Intervention Category Major Interventions: Hyperglycemia - active titration of insulin therapy  Lenell Antu 12/27/2019, 8:56 PM

## 2019-12-27 NOTE — ED Notes (Signed)
Report called for pt transfer to the floor  

## 2019-12-27 NOTE — Progress Notes (Signed)
Inpatient Diabetes Program Recommendations  AACE/ADA: New Consensus Statement on Inpatient Glycemic Control (2015)  Target Ranges:  Prepandial:   less than 140 mg/dL      Peak postprandial:   less than 180 mg/dL (1-2 hours)      Critically ill patients:  140 - 180 mg/dL   Lab Results  Component Value Date   GLUCAP 173 (H) 12/27/2019   HGBA1C 10.5 (H) 12/27/2019    Review of Glycemic Control  Diabetes history: DM1 Outpatient Diabetes medications: Insulin pump - T-slim Current orders for Inpatient glycemic control: IV insulin per EndoTool transitioning to insulin pump  HgbA1C - 10.5% - uncontrolled  Basal Rate: Correction: Carb Ratio: BG Target:  Current New Current New Current New Current New  00:00 0.6 0.7 55 15  07:00 am 0.6 15  01:00 pm 0.6 0.5 15  05:00 pm 0._0 08:00 pm 0.6 0.7 15  10:00 pm 15   Active Insulin Time: 5 hr  Control IQ TDD 36 units  Control IQ Wt. 139 lb   Talked with pt at bedside regarding his insulin pump. Pt states the reason he went into DKA was d/t Dexcom G6 not working. Asked pt why he didn't check blood sugar with a glucose meter and he said he didn't know why. Pt states he will not be going back to Dr Tamala Julian and does not have a doctor to f/u with. Does not know pump settings. Needs more education with pump, but states he will not be going back to Dr. Tamala Julian, Endo. Per EMR, pt has had multiple low sugars at night.    Inpatient Diabetes Program Recommendations:     May need to be discharged on Lantus/Novolog as pt needs more education with insulin pump. Also, Dexcom is not working properly. If basal-bolus used, Lantus 18 units QD, Novolog 0-9 units tidwc and hs + 3 units tidwc if pt eats > 50% meal  Will need Endo appt prior to discharge.  Will need prescription for blood glucose meter kit.  Pt does not seem to take his diabetes seriously.  Will f/u in am.   Thank you. Lorenda Peck, RD, LDN, CDE Inpatient Diabetes  Coordinator [phone]

## 2019-12-28 ENCOUNTER — Encounter (HOSPITAL_COMMUNITY): Payer: Self-pay | Admitting: Critical Care Medicine

## 2019-12-28 ENCOUNTER — Inpatient Hospital Stay (HOSPITAL_COMMUNITY): Payer: 59

## 2019-12-28 ENCOUNTER — Other Ambulatory Visit (HOSPITAL_COMMUNITY): Payer: 59

## 2019-12-28 ENCOUNTER — Encounter (HOSPITAL_COMMUNITY)
Admission: EM | Disposition: A | Discharge status: Home / Self Care | Payer: Self-pay | Source: Home / Self Care | Attending: Internal Medicine

## 2019-12-28 DIAGNOSIS — R778 Other specified abnormalities of plasma proteins: Secondary | ICD-10-CM

## 2019-12-28 DIAGNOSIS — I361 Nonrheumatic tricuspid (valve) insufficiency: Secondary | ICD-10-CM

## 2019-12-28 DIAGNOSIS — R079 Chest pain, unspecified: Secondary | ICD-10-CM

## 2019-12-28 DIAGNOSIS — R0602 Shortness of breath: Secondary | ICD-10-CM

## 2019-12-28 HISTORY — PX: LEFT HEART CATH AND CORONARY ANGIOGRAPHY: CATH118249

## 2019-12-28 LAB — BASIC METABOLIC PANEL
Anion gap: 14 (ref 5–15)
Anion gap: 11 (ref 5–15)
BUN: 19 mg/dL (ref 6–20)
BUN: 18 mg/dL (ref 6–20)
CO2: 19 mmol/L — ABNORMAL LOW (ref 22–32)
CO2: 23 mmol/L (ref 22–32)
Calcium: 8.5 mg/dL — ABNORMAL LOW (ref 8.9–10.3)
Calcium: 8.4 mg/dL — ABNORMAL LOW (ref 8.9–10.3)
Chloride: 97 mmol/L — ABNORMAL LOW (ref 98–111)
Chloride: 98 mmol/L (ref 98–111)
Creatinine, Ser: 1 mg/dL (ref 0.61–1.24)
Creatinine, Ser: 0.68 mg/dL (ref 0.61–1.24)
GFR calc Af Amer: >60 mL/min (ref >60)
GFR calc Af Amer: >60 mL/min (ref >60)
GFR calc non Af Amer: >60 mL/min (ref >60)
GFR calc non Af Amer: >60 mL/min (ref >60)
Glucose, Bld: 227 mg/dL — ABNORMAL HIGH (ref 70–99)
Glucose, Bld: 153 mg/dL — ABNORMAL HIGH (ref 70–99)
Potassium: 3.1 mmol/L — ABNORMAL LOW (ref 3.5–5.1)
Potassium: 3.9 mmol/L (ref 3.5–5.1)
Sodium: 130 mmol/L — ABNORMAL LOW (ref 135–145)
Sodium: 132 mmol/L — ABNORMAL LOW (ref 135–145)

## 2019-12-28 LAB — GLUCOSE, CAPILLARY
Glucose-Capillary: 104 mg/dL — ABNORMAL HIGH (ref 70–99)
Glucose-Capillary: 113 mg/dL — ABNORMAL HIGH (ref 70–99)
Glucose-Capillary: 132 mg/dL — ABNORMAL HIGH (ref 70–99)
Glucose-Capillary: 136 mg/dL — ABNORMAL HIGH (ref 70–99)
Glucose-Capillary: 147 mg/dL — ABNORMAL HIGH (ref 70–99)
Glucose-Capillary: 149 mg/dL — ABNORMAL HIGH (ref 70–99)
Glucose-Capillary: 154 mg/dL — ABNORMAL HIGH (ref 70–99)
Glucose-Capillary: 157 mg/dL — ABNORMAL HIGH (ref 70–99)
Glucose-Capillary: 190 mg/dL — ABNORMAL HIGH (ref 70–99)
Glucose-Capillary: 192 mg/dL — ABNORMAL HIGH (ref 70–99)
Glucose-Capillary: 200 mg/dL — ABNORMAL HIGH (ref 70–99)
Glucose-Capillary: 218 mg/dL — ABNORMAL HIGH (ref 70–99)
Glucose-Capillary: 243 mg/dL — ABNORMAL HIGH (ref 70–99)
Glucose-Capillary: 249 mg/dL — ABNORMAL HIGH (ref 70–99)
Glucose-Capillary: 294 mg/dL — ABNORMAL HIGH (ref 70–99)
Glucose-Capillary: 321 mg/dL — ABNORMAL HIGH (ref 70–99)
Glucose-Capillary: 89 mg/dL (ref 70–99)

## 2019-12-28 LAB — CBC
HCT: 37.7 % — ABNORMAL LOW (ref 39.0–52.0)
Hemoglobin: 13.6 g/dL (ref 13.0–17.0)
MCH: 31.7 pg (ref 26.0–34.0)
MCHC: 36.1 g/dL — ABNORMAL HIGH (ref 30.0–36.0)
MCV: 87.9 fL (ref 80.0–100.0)
Platelets: 247 10*3/uL (ref 150–400)
RBC: 4.29 MIL/uL (ref 4.22–5.81)
RDW: 13.2 % (ref 11.5–15.5)
WBC: 8.5 10*3/uL (ref 4.0–10.5)
nRBC: 0 % (ref 0.0–0.2)

## 2019-12-28 LAB — SEDIMENTATION RATE: Sed Rate: 5 mm/hr (ref 0–16)

## 2019-12-28 LAB — TROPONIN I (HIGH SENSITIVITY): Troponin I (High Sensitivity): 240 ng/L (ref <18)

## 2019-12-28 LAB — C-REACTIVE PROTEIN: CRP: 0.7 mg/dL (ref <1.0)

## 2019-12-28 LAB — ECHOCARDIOGRAM COMPLETE
Height: 67 in
Weight: 2292.78 oz

## 2019-12-28 LAB — CREATININE, SERUM
Creatinine, Ser: 0.82 mg/dL (ref 0.61–1.24)
GFR calc Af Amer: >60 mL/min (ref >60)
GFR calc non Af Amer: >60 mL/min (ref >60)

## 2019-12-28 SURGERY — LEFT HEART CATH AND CORONARY ANGIOGRAPHY
Anesthesia: LOCAL

## 2019-12-28 MED ORDER — HEPARIN SODIUM (PORCINE) 5000 UNIT/ML IJ SOLN
5000.0000 [IU] | Freq: Three times a day (TID) | INTRAMUSCULAR | Status: DC
  Administered 2019-12-28 – 2019-12-29 (×2): 5000 [IU] via SUBCUTANEOUS
  Filled 2019-12-28 (×2): qty 1

## 2019-12-28 MED ORDER — ASPIRIN 81 MG PO CHEW
324.0000 mg | CHEWABLE_TABLET | ORAL | Status: AC
  Administered 2019-12-28: 11:00:00 324 mg via ORAL
  Filled 2019-12-28: qty 4

## 2019-12-28 MED ORDER — SODIUM CHLORIDE 0.9 % IV SOLN: 250.0000 mL | INTRAVENOUS | Status: DC | PRN

## 2019-12-28 MED ORDER — IOHEXOL 350 MG/ML SOLN
INTRAVENOUS | Status: DC | PRN
  Administered 2019-12-28: 40 mL

## 2019-12-28 MED ORDER — MIDAZOLAM HCL 2 MG/2ML IJ SOLN
INTRAMUSCULAR | Status: DC | PRN
  Administered 2019-12-28: 2 mg via INTRAVENOUS

## 2019-12-28 MED ORDER — SODIUM CHLORIDE 0.9 % IV SOLN: INTRAVENOUS | Status: AC

## 2019-12-28 MED ORDER — VERAPAMIL HCL 2.5 MG/ML IV SOLN
INTRAVENOUS | Status: DC | PRN
  Administered 2019-12-28: 10 mL via INTRA_ARTERIAL

## 2019-12-28 MED ORDER — POTASSIUM CHLORIDE 10 MEQ/100ML IV SOLN
10.0000 meq | INTRAVENOUS | Status: DC
  Administered 2019-12-28: 03:00:00 10 meq via INTRAVENOUS
  Filled 2019-12-28: qty 100

## 2019-12-28 MED ORDER — POTASSIUM CHLORIDE CRYS ER 20 MEQ PO TBCR
40.0000 meq | EXTENDED_RELEASE_TABLET | Freq: Once | ORAL | Status: AC
  Administered 2019-12-28: 40 meq via ORAL
  Filled 2019-12-28: qty 2

## 2019-12-28 MED ORDER — ASPIRIN 81 MG PO CHEW: 81.0000 mg | CHEWABLE_TABLET | ORAL | Status: DC

## 2019-12-28 MED ORDER — LIDOCAINE HCL (PF) 1 % IJ SOLN
INTRAMUSCULAR | Status: AC
  Filled 2019-12-28: qty 30

## 2019-12-28 MED ORDER — IOHEXOL 350 MG/ML SOLN
INTRAVENOUS | Status: AC
  Filled 2019-12-28: qty 1

## 2019-12-28 MED ORDER — HEPARIN (PORCINE) IN NACL 1000-0.9 UT/500ML-% IV SOLN
INTRAVENOUS | Status: AC
  Filled 2019-12-28: qty 500

## 2019-12-28 MED ORDER — SODIUM CHLORIDE 0.9% FLUSH: 3.0000 mL | INTRAVENOUS | Status: DC | PRN

## 2019-12-28 MED ORDER — ACETAMINOPHEN 325 MG PO TABS
650.0000 mg | ORAL_TABLET | ORAL | Status: DC | PRN
  Administered 2019-12-28: 650 mg via ORAL
  Filled 2019-12-28: qty 2

## 2019-12-28 MED ORDER — VERAPAMIL HCL 2.5 MG/ML IV SOLN
INTRAVENOUS | Status: AC
  Filled 2019-12-28: qty 2

## 2019-12-28 MED ORDER — INSULIN ASPART 100 UNIT/ML ~~LOC~~ SOLN
4.0000 [IU] | Freq: Three times a day (TID) | SUBCUTANEOUS | Status: DC
  Administered 2019-12-28 – 2019-12-29 (×2): 4 [IU] via SUBCUTANEOUS

## 2019-12-28 MED ORDER — HEPARIN (PORCINE) IN NACL 1000-0.9 UT/500ML-% IV SOLN
INTRAVENOUS | Status: DC | PRN
  Administered 2019-12-28 (×2): 500 mL

## 2019-12-28 MED ORDER — SODIUM CHLORIDE 0.9 % IV SOLN
INTRAVENOUS | Status: AC | PRN
  Administered 2019-12-28: 250 mL via INTRAVENOUS
  Administered 2019-12-28: 10 mL/h via INTRAVENOUS

## 2019-12-28 MED ORDER — INSULIN ASPART 100 UNIT/ML ~~LOC~~ SOLN
0.0000 [IU] | Freq: Every day | SUBCUTANEOUS | Status: DC
  Administered 2019-12-28: 4 [IU] via SUBCUTANEOUS

## 2019-12-28 MED ORDER — INSULIN ASPART 100 UNIT/ML ~~LOC~~ SOLN
0.0000 [IU] | Freq: Three times a day (TID) | SUBCUTANEOUS | Status: DC
  Administered 2019-12-28: 5 [IU] via SUBCUTANEOUS
  Administered 2019-12-29: 8 [IU] via SUBCUTANEOUS
  Administered 2019-12-29: 13:00:00 11 [IU] via SUBCUTANEOUS

## 2019-12-28 MED ORDER — HYDRALAZINE HCL 20 MG/ML IJ SOLN: 10.0000 mg | INTRAMUSCULAR | Status: AC | PRN

## 2019-12-28 MED ORDER — INSULIN GLARGINE 100 UNIT/ML ~~LOC~~ SOLN
18.0000 [IU] | Freq: Once | SUBCUTANEOUS | Status: AC
  Administered 2019-12-28: 18 [IU] via SUBCUTANEOUS
  Filled 2019-12-28: qty 0.18

## 2019-12-28 MED ORDER — LABETALOL HCL 5 MG/ML IV SOLN: 10.0000 mg | INTRAVENOUS | Status: AC | PRN

## 2019-12-28 MED ORDER — LIDOCAINE HCL (PF) 1 % IJ SOLN
INTRAMUSCULAR | Status: DC | PRN
  Administered 2019-12-28: 2 mL

## 2019-12-28 MED ORDER — SODIUM CHLORIDE 0.9 % WEIGHT BASED INFUSION: 1.0000 mL/kg/h | INTRAVENOUS | Status: DC

## 2019-12-28 MED ORDER — INSULIN GLARGINE 100 UNIT/ML ~~LOC~~ SOLN
18.0000 [IU] | Freq: Every day | SUBCUTANEOUS | Status: DC
  Filled 2019-12-28: qty 0.18

## 2019-12-28 MED ORDER — ONDANSETRON HCL 4 MG/2ML IJ SOLN: 4.0000 mg | Freq: Four times a day (QID) | INTRAMUSCULAR | Status: DC | PRN

## 2019-12-28 MED ORDER — SODIUM CHLORIDE 0.9 % WEIGHT BASED INFUSION
3.0000 mL/kg/h | INTRAVENOUS | Status: DC
  Administered 2019-12-28: 3 mL/kg/h via INTRAVENOUS

## 2019-12-28 MED ORDER — IOHEXOL 350 MG/ML SOLN
100.0000 mL | Freq: Once | INTRAVENOUS | Status: AC | PRN
  Administered 2019-12-28: 15:00:00 100 mL via INTRAVENOUS

## 2019-12-28 MED ORDER — ASPIRIN 325 MG PO TABS: 325.0000 mg | ORAL_TABLET | Freq: Every day | ORAL | Status: DC

## 2019-12-28 MED ORDER — SODIUM CHLORIDE 0.9% FLUSH: 3.0000 mL | Freq: Two times a day (BID) | INTRAVENOUS | Status: DC

## 2019-12-28 MED ORDER — ALUM & MAG HYDROXIDE-SIMETH 200-200-20 MG/5ML PO SUSP: 30.0000 mL | ORAL | Status: DC | PRN

## 2019-12-28 MED ORDER — SODIUM CHLORIDE 0.9% FLUSH
3.0000 mL | Freq: Two times a day (BID) | INTRAVENOUS | Status: DC
  Administered 2019-12-28: 3 mL via INTRAVENOUS

## 2019-12-28 MED ORDER — ZOLPIDEM TARTRATE 5 MG PO TABS
5.0000 mg | ORAL_TABLET | Freq: Once | ORAL | Status: AC
  Administered 2019-12-28: 5 mg via ORAL
  Filled 2019-12-28: qty 1

## 2019-12-28 MED ORDER — FENTANYL CITRATE (PF) 100 MCG/2ML IJ SOLN
INTRAMUSCULAR | Status: AC
  Filled 2019-12-28: qty 2

## 2019-12-28 MED ORDER — SODIUM CHLORIDE 0.9 % WEIGHT BASED INFUSION: 3.0000 mL/kg/h | INTRAVENOUS | Status: DC

## 2019-12-28 MED ORDER — SODIUM CHLORIDE 0.9 % WEIGHT BASED INFUSION
1.0000 mL/kg/h | INTRAVENOUS | Status: DC
  Administered 2019-12-28: 1 mL/kg/h via INTRAVENOUS

## 2019-12-28 MED ORDER — ASPIRIN 325 MG PO TABS
325.0000 mg | ORAL_TABLET | Freq: Every day | ORAL | Status: DC
  Administered 2019-12-29: 325 mg via ORAL
  Filled 2019-12-28: qty 1

## 2019-12-28 MED ORDER — HEPARIN SODIUM (PORCINE) 1000 UNIT/ML IJ SOLN
INTRAMUSCULAR | Status: DC | PRN
  Administered 2019-12-28: 2000 [IU] via INTRAVENOUS

## 2019-12-28 MED ORDER — MIDAZOLAM HCL 2 MG/2ML IJ SOLN
INTRAMUSCULAR | Status: AC
  Filled 2019-12-28: qty 2

## 2019-12-28 MED ORDER — FENTANYL CITRATE (PF) 100 MCG/2ML IJ SOLN
INTRAMUSCULAR | Status: DC | PRN
  Administered 2019-12-28: 25 ug via INTRAVENOUS

## 2019-12-28 MED ORDER — HEPARIN SODIUM (PORCINE) 5000 UNIT/ML IJ SOLN
3000.0000 [IU] | Freq: Once | INTRAMUSCULAR | Status: AC
  Administered 2019-12-28: 12:00:00 3000 [IU] via INTRAVENOUS
  Filled 2019-12-28: qty 1

## 2019-12-28 SURGICAL SUPPLY — 9 items
CATH 5FR JL3.5 JR4 ANG PIG MP (CATHETERS) ×2 IMPLANT
DEVICE RAD COMP TR BAND LRG (VASCULAR PRODUCTS) ×2 IMPLANT
GLIDESHEATH SLEND SS 6F .021 (SHEATH) ×2 IMPLANT
GUIDEWIRE INQWIRE 1.5J.035X260 (WIRE) ×1 IMPLANT
INQWIRE 1.5J .035X260CM (WIRE) ×2
KIT HEART LEFT (KITS) ×2 IMPLANT
PACK CARDIAC CATHETERIZATION (CUSTOM PROCEDURE TRAY) ×2 IMPLANT
TRANSDUCER W/STOPCOCK (MISCELLANEOUS) ×2 IMPLANT
TUBING CIL FLEX 10 FLL-RA (TUBING) ×2 IMPLANT

## 2019-12-28 NOTE — Progress Notes (Signed)
    COncern with NSTEMI vs. Pericarditis.  All questions answered.   Cath Lab Visit (complete for each Cath Lab visit)  Clinical Evaluation Leading to the Procedure:   ACS: Yes.    Non-ACS:    Anginal Classification: CCS IV  Anti-ischemic medical therapy: Minimal Therapy (1 class of medications)  Non-Invasive Test Results: No non-invasive testing performed  Prior CABG: No previous CABG   Corky Crafts, MD

## 2019-12-28 NOTE — Progress Notes (Addendum)
RN noticed patient's bedside monitor to be ringing out 'ST elevation'. EKG performed , and findings included 'STEMI, consider anterolateral injury'. Pt did note chest pain while eating, and slight chest discomfort while lying in bed. RR 17, O2 100%, HR 78, BP 105/53. Pt also reports slight SOB. RN placed 2L O2 via [place] on pt, and paged Dr. Tyson Babinski. Orders placed to check patient's troponin, and perform an echo. RN will continue to monitor pt response to interventions, and monitor s/s.

## 2019-12-28 NOTE — Progress Notes (Signed)
Inpatient Diabetes Program Recommendations  AACE/ADA: New Consensus Statement on Inpatient Glycemic Control (2015)  Target Ranges:  Prepandial:   less than 140 mg/dL      Peak postprandial:   less than 180 mg/dL (1-2 hours)      Critically ill patients:  140 - 180 mg/dL   Lab Results  Component Value Date   GLUCAP 249 (H) 12/28/2019   HGBA1C 10.5 (H) 12/27/2019    Review of Glycemic Control  Transferring to [facility] for code STEMI. Still on IV insulin per EndoTool.   Will alert Diabetes Coordinators at [facility] to watch for him.   Thank you. Ailene Ards, RD, LDN, CDE Inpatient Diabetes Coordinator [phone]

## 2019-12-28 NOTE — Progress Notes (Signed)
  Echocardiogram 2D Echocardiogram has been performed.  Blake Sherman 12/28/2019, 11:22 AM

## 2019-12-28 NOTE — Progress Notes (Signed)
eLink Physician-Brief Progress Note Patient Name: Blake Sherman DOB: [birth year] MRN: [id number]   Date of Service  12/28/2019  HPI/Events of Note  Patient can't tolerate KCl via periferal IV d/t burning.   eICU Interventions  Will order: 1. D/C IV KCl replacement. 2. Klor-con 40 meq PO X 1 now. 3. Repeat BMP at 8 AM.     Intervention Category Major Interventions: Other:  Blake Sherman 12/28/2019, 3:27 AM

## 2019-12-28 NOTE — Progress Notes (Signed)
PCCM INTERVAL PROGRESS NOTE   Postassium noted at 3.1 on midnight labs.   Give 40 meq KCl IV   Joneen Roach, AGACNP-BC [facility]  See Amion for personal pager PCCM on call pager [phone]  12/28/2019 2:08 AM

## 2019-12-28 NOTE — Progress Notes (Signed)
Inpatient Diabetes Program Recommendations  AACE/ADA: New Consensus Statement on Inpatient Glycemic Control (2015)  Target Ranges:  Prepandial:   less than 140 mg/dL      Peak postprandial:   less than 180 mg/dL (1-2 hours)      Critically ill patients:  140 - 180 mg/dL   Lab Results  Component Value Date   GLUCAP 149 (H) 12/28/2019   HGBA1C 10.5 (H) 12/27/2019    Review of Glycemic Control  Diabetes history: DM1 Outpatient Diabetes medications: Insulin pump Current orders for Inpatient glycemic control: IV insulin  Pt back on IV insulin last night. Insulin pump not working and blood sugar continued to increase after dinner.  Inpatient Diabetes Program Recommendations:     When ready for transition, give Lantus 18 units at least 2 hours prior to discontinuation of drip. Do not restart insulin pump. Pt will need to f/u with Endo prior to restarting pump.  Will need to be discharged on Lantus and Novolog pens. Will also need blood glucose monitoring kit.  Will see this am.   Thank you. Lorenda Peck, RD, LDN, CDE Inpatient Diabetes Coordinator [phone]

## 2019-12-28 NOTE — Progress Notes (Signed)
RN gave report to [employer], and all questions were answered at this time. RN administered the ordered 325 mg of ASA, and 3,000 units of heparin SQ in preparation for cardiac cath. All patient belongings were sent with the pt, including cell phone, charger, insulin pump, and clothes. Insulin gtt currently infusing at 3.8 units/hr per Endotool. Carelink RN notified that next CBG is due at 1155, and will need to be entered in [place]. [facility]link will continue to monitor pt.

## 2019-12-28 NOTE — Progress Notes (Signed)
Pt to CT Scan per MD order. And then to 6 east

## 2019-12-28 NOTE — Progress Notes (Addendum)
Nursing staff called me for abnormal telemetry monitor showing possible ST elevation.  EKG stat was performed which showed some evidence of ST elevation and possibility of repolarization.  Patient did complain of some chest pain during eating and had some shortness of breath.  I immediately spoke with cardiology Dr. Scharlene Gloss.  Cardiology recommended getting troponins and 2D echocardiogram.  I was subsequently contacted by cardiology Dr. Danise Edge who indicated that he would activate code STEMI in consider for cardiac catheterization as he had mild elevated troponins.  Patient will be taken over in cardiology service if he had a stent.  If not patient will be transferred back to [facility].

## 2019-12-28 NOTE — Progress Notes (Addendum)
PROGRESS NOTE  Blake Sherman HKV:[id number] DOB: [birth year] DOA: 12/26/2019 PCP: No primary care provider on file.   LOS: 1 day   Brief narrative: As per HPI,  24 year old male with PMH significant for diabetes type 1 and hypothyroidism, currently following up with endocrinology at [facility] Dr. Tamala Julian, previously on Lantus and prandial insulin but currently being on insulin pump presented to hospital with nausea vomiting malaise.  Patient stated that the pump was malfunctioning.  He could not communicate with his endocrinologist so he presented to the hospital.  On presentation his blood glucose levels were elevated at 700 and was noted to have diabetic ketoacidosis with VBG pH of 6.9 and bicarb on BMP of less than 7. He was started on insulin and bicarbonate infusion and PCCM was asked to admit.   Patient was subsequently transferred out of the ICU.  Assessment/Plan:  Active Problems:   Autoimmune thyroiditis   DKA (diabetic ketoacidoses) ([place])  Diabetic ketoacidosis: Likely secondary to malfunction of insulin pump and inadequate insulin.  Patient did have significant anion gap and metabolic acidosis with elevated beta hydroxybutyrate.  He was initially admitted to ICU.  A1c of 10.5 at this time.  Patient received insulin drip and will be switched to long-acting insulin at this time until his insulin pump will be addressed as outpatient.  Patient states that his insurance has changed and will need a new endocrinology doctor.  We will hold off on using insulin pump.  Hyperkalemia on presentation with Pseudohyponatremia . Improved with hydration and insulin.  Hypothyroidism. TSH of 94.0 compared to 14.0- 4 years back.  Patient was advised to continue Synthroid.  He has not been taking her Synthroid for at least a week.  He was last seen by endocrinologist almost a month back.  Will need to manage as outpatient with endocrinologist.  VTE Prophylaxis: heparin suq  Code Status:   Full code  Family Communication: I tried to call patient's her sister on the phone but was unable to reach.  Disposition Plan:  . Patient is from home . Likely disposition to home by tomorrow . Barriers to discharge: Transition from insulin drip to long-acting insulin, transfer out of the ICU to telemetry   Consultants:  PCCM  Procedures:  None  Antibiotics:  . None  Anti-infectives (From admission, onward)   None     Subjective: Today, patient was seen and examined at bedside.  Denies any  vomiting or abdominal pain, has mild nausea.  Denies chest pain fever chills.  Objective: Vitals:   12/28/19 0800 12/28/19 0813  BP:  110/69  Pulse: 100 85  Resp: 17 14  Temp:    SpO2: 99% 98%    Intake/Output Summary (Last 24 hours) at 12/28/2019 0926 Last data filed at 12/28/2019 0814 Gross per 24 hour  Intake 675.88 ml  Output 1000 ml  Net -324.12 ml   Filed Weights   12/26/19 2353 12/27/19 0342  Weight: 65 kg 65 kg   Body mass index is 22.44 kg/m.   Physical Exam: GENERAL: Patient is alert awake and oriented. Not in obvious distress.  Thinly built HENT: No scleral pallor or icterus. Pupils equally reactive to light. Oral mucosa is moist NECK: is supple, no gross swelling noted. CHEST: Clear to auscultation. No crackles or wheezes.  Diminished breath sounds bilaterally. CVS: S1 and S2 heard, no murmur. Regular rate and rhythm.  ABDOMEN: Soft, non-tender, bowel sounds are present. EXTREMITIES: No edema. CNS: Cranial nerves are intact.  No focal motor deficits. SKIN: warm and dry without rashes.  Data Review: I have personally reviewed the following laboratory data and studies,  CBC: Recent Labs  Lab 12/26/19 2152 12/26/19 2218  WBC 30.7*  --   NEUTROABS 24.9*  --   HGB 17.1* 17.3*  HCT 50.5 51.0  MCV 95.3  --   PLT 554*  --    Basic Metabolic Panel: Recent Labs  Lab 12/27/19 0220 12/27/19 0635 12/27/19 1021 12/27/19 1514 12/27/19 1817 12/28/19 0028  12/28/19 0752  NA 126*   < > 130* 132* 131* 130* 132*  K 4.5   < > 3.7 3.4* 3.8 3.1* 3.9  CL 96*   < > 101 98 96* 97* 98  CO2 7*   < > 20* 23 21* 19* 23  GLUCOSE 445*   < > 193* 144* 215* 227* 153*  BUN 33*   < > 22* 18 17 19 18   CREATININE 1.42*   < > 0.87 0.84 0.84 1.00 0.68  CALCIUM 8.0*   < > 8.0* 8.1* 8.2* 8.5* 8.4*  MG 2.0  --   --   --   --   --   --   PHOS 2.4*  --   --   --   --   --   --    < > = values in this interval not displayed.   Liver Function Tests: Recent Labs  Lab 12/26/19 2152  AST 66*  ALT 44  ALKPHOS 100  BILITOT 1.9*  PROT 9.9*  ALBUMIN 5.3*   Recent Labs  Lab 12/26/19 2152  LIPASE 23   No results for input(s): AMMONIA in the last 168 hours. Cardiac Enzymes: No results for input(s): CKTOTAL, CKMB, CKMBINDEX, TROPONINI in the last 168 hours. BNP (last 3 results) No results for input(s): BNP in the last 8760 hours.  ProBNP (last 3 results) No results for input(s): PROBNP in the last 8760 hours.  CBG: Recent Labs  Lab 12/28/19 0504 12/28/19 0611 12/28/19 0633 12/28/19 0734 12/28/19 0835  GLUCAP 132* 89 157* 149* 147*   Recent Results (from the past 240 hour(s))  SARS CORONAVIRUS 2 (TAT 6-24 HRS) Nasopharyngeal Nasopharyngeal Swab     Status: None   Collection Time: 12/27/19  1:05 AM   Specimen: Nasopharyngeal Swab  Result Value Ref Range Status   SARS Coronavirus 2 NEGATIVE NEGATIVE Final    Comment: (NOTE) SARS-CoV-2 target nucleic acids are NOT DETECTED. The SARS-CoV-2 RNA is generally detectable in upper and lower respiratory specimens during the acute phase of infection. Negative results do not preclude SARS-CoV-2 infection, do not rule out co-infections with other pathogens, and should not be used as the sole basis for treatment or other patient management decisions. Negative results must be combined with clinical observations, patient history, and epidemiological information. The expected result is Negative. Fact Sheet for  Patients: 02/26/20 Fact Sheet for Healthcare Providers: HairSlick.no This test is not yet approved or cleared by the quierodirigir.com FDA and  has been authorized for detection and/or diagnosis of SARS-CoV-2 by FDA under an Emergency Use Authorization (EUA). This EUA will remain  in effect (meaning this test can be used) for the duration of the COVID-19 declaration under Section 56 4(b)(1) of the Act, 21 U.S.C. section 360bbb-3(b)(1), unless the authorization is terminated or revoked sooner. Performed at [facility], [address], [place], [place]   MRSA PCR Screening     Status: Abnormal   Collection Time: 12/27/19  5:04 AM   Specimen: Nasal Mucosa; Nasopharyngeal  Result Value Ref Range Status   MRSA by PCR POSITIVE (A) NEGATIVE Final    Comment:        The GeneXpert MRSA Assay (FDA approved for NASAL specimens only), is one component of a comprehensive MRSA colonization surveillance program. It is not intended to diagnose MRSA infection nor to guide or monitor treatment for MRSA infections. RESULT CALLED TO, READ BACK BY AND VERIFIED WITH: L.CAUDLE RN AT 1203 ON 12/27/19 BY S.VANHOORNE Performed at [facility], [address], [place], [place] [address]      Studies: No results found.   Joycelyn Das, MD  [facility] 12/28/2019

## 2019-12-28 NOTE — Progress Notes (Addendum)
I was called by cath lab that the patient will be in [facility]. No bed is available at [facility] to come back as per the flow Production designer, theatre/television/film. Will sign out the patient to team 9 at [facility] for urgent issues. Spoke with the nursing staff at [facility] cath lab for stat CTA to rule out pulmonary embolism.   Addendum:  12/28/2019 3:01 PM   CT of the chest did not show evidence of pulmonary embolism/ pneumonia, no RV strain reported.  Will closely monitor the patient.  If he continues to have symptoms including dyspnea and chest pain he might benefit from empirically anticoagulation for false-negative CTA/also obtain D-dimer and ultrasound of the lower extremities.

## 2019-12-28 NOTE — Consult Note (Signed)
Cardiology Consultation:   Patient ID: Blake Sherman MRN: [id number]; DOB: 28-Feb-1996  Admit date: 12/26/2019 Date of Consult: 12/28/2019  Primary Care Provider: No primary care provider on file. Primary Cardiologist: No primary care provider on file.  Primary Electrophysiologist:  None    Patient Profile:   Blake Sherman is a 24 y.o. male with a hx of DM type 1 and hypothyroidism who is being seen today for the evaluation of chest discomfort and abnormal EKG at the request of Dr. Tyson Babinski.  History of Present Illness:   Blake Sherman has been a type 1 diabetic since about age 89. We are asked to see him for complaints of chest pain and ST elevation on EKG. He states that he has had about 1 week history of discomfort in the central chest and epigastric area. This occurs whenever he tries to eat or drink anything. He felt like it was gastric reflux. He has never had this discomfort before. It also occurs to a lesser extent occasionally when not trying to eat/drink. He states that he has not been able to do any type of activity so does not know if worse with activity. He also notes shortness of breath that is worse with exertion.   He denies any prior cardiac issues. His father has a history of CAD and CBG, unknown age of onset. He does not smoke tobacco but smoke marijuana daily. He denies alcohol use.   Heart Pathway Score:     Past Medical History:  Diagnosis Date  . Diabetes mellitus without complication [facility])    diagnosed July 2014  . Hypothyroidism     History reviewed. No pertinent surgical history.   Home Medications:  Prior to Admission medications   Medication Sig Start Date End Date Taking? Authorizing Provider  glucagon 1 MG injection Use for Severe Hypoglycemia . Inject 1 mg intramuscularly if unresponsive, unable to swallow, unconscious and/or has seizure 09/18/15  Yes Beasley, Spenser, NP  LANTUS SOLOSTAR 100 UNIT/ML Solostar Pen INJECT UP TO 50 UNITS UNDER THE  SKIN DAILY Patient taking differently: Inject 50 Units into the skin daily.  02/18/16  Yes Gretchen Short, NP  ACCU-CHEK FASTCLIX LANCETS MISC USE TO TEST BLOOD SUGAR SEVEN TIMES EVERY DAY 09/23/16   Georges Mouse, NP  ACCU-CHEK SMARTVIEW test strip USE AS DIRECTED TO CHEEK BLOOD SUGAR SIX TIMES DAILY 02/13/16   Dessa Phi, MD  acetone, urine, test strip Check ketones per protocol 05/22/13   Saverio Danker, MD  BD PEN NEEDLE NANO U/F 32G X 4 MM MISC USE TO INJECT INSULIN SIX TIMES DAILY 09/23/16   Dessa Phi, MD  HYDROcodone-acetaminophen (NORCO/VICODIN) 5-325 MG tablet Take 1 tablet by mouth every 4 (four) hours as needed. Patient not taking: Reported on 12/26/2019 12/30/15   Sam, Ace Gins, PA-C  ibuprofen (ADVIL,MOTRIN) 800 MG tablet Take 1 tablet (800 mg total) by mouth 3 (three) times daily. Patient not taking: Reported on 12/26/2019 12/30/15   Sam, Ace Gins, PA-C  levothyroxine (SYNTHROID, LEVOTHROID) 125 MCG tablet TAKE 1 TABLET BY MOUTH EVERY MORNING BEFORE BREAKFAST Patient not taking: Reported on 12/26/2019 02/13/16   Verneda Skill, FNP  methocarbamol (ROBAXIN) 500 MG tablet Take 1 tablet (500 mg total) by mouth 2 (two) times daily. Patient not taking: Reported on 12/26/2019 12/30/15   Sam, Ace Gins, PA-C  NOVOLOG FLEXPEN 100 UNIT/ML FlexPen INJECT UP TO 50 UNITS UNDER THE SKIN DAILY Patient not taking: Reported on 12/26/2019 02/18/16   Gretchen Short, NP  Inpatient Medications: Scheduled Meds: . Chlorhexidine Gluconate Cloth  6 each Topical Daily  . heparin  5,000 Units Subcutaneous Q8H  . insulin pump   Subcutaneous TID WC, HS, 0200  . levothyroxine  100 mcg Oral Q0600  . levothyroxine  25 mcg Oral Q0600  . mupirocin ointment  1 application Nasal BID   Continuous Infusions: . insulin 3.8 Units/hr (12/28/19 1057)   PRN Meds: dextrose, ondansetron (ZOFRAN) IV  Allergies:   No Known Allergies  Social History:   Social History   Socioeconomic History  . Marital  status: Single    Spouse name: Not on file  . Number of children: Not on file  . Years of education: Not on file  . Highest education level: Not on file  Occupational History  . Not on file  Tobacco Use  . Smoking status: Current Every Day Smoker  . Smokeless tobacco: Never Used  Substance and Sexual Activity  . Alcohol use: No  . Drug use: Yes    Types: Marijuana  . Sexual activity: Never  Other Topics Concern  . Not on file  Social History Narrative   10th grade at [employer]. Lives with parents and 2 sisters and 1 brother. Wrestling?   Social Determinants of Health   Financial Resource Strain:   . Difficulty of Paying Living Expenses: Not on file  Food Insecurity:   . Worried About Programme researcher, broadcasting/film/video in the Last Year: Not on file  . Ran Out of Food in the Last Year: Not on file  Transportation Needs:   . Lack of Transportation (Medical): Not on file  . Lack of Transportation (Non-Medical): Not on file  Physical Activity:   . Days of Exercise per Week: Not on file  . Minutes of Exercise per Session: Not on file  Stress:   . Feeling of Stress : Not on file  Social Connections:   . Frequency of Communication with Friends and Family: Not on file  . Frequency of Social Gatherings with Friends and Family: Not on file  . Attends Religious Services: Not on file  . Active Member of Clubs or Organizations: Not on file  . Attends Banker Meetings: Not on file  . Marital Status: Not on file  Intimate Partner Violence:   . Fear of Current or Ex-Partner: Not on file  . Emotionally Abused: Not on file  . Physically Abused: Not on file  . Sexually Abused: Not on file    Family History:    Family History  Problem Relation Age of Onset  . Anemia Mother   . Diabetes Father   . Hypertension Father   . CAD Father        CABG  . Anemia Sister   . Diabetes Paternal Grandmother      ROS:  Please see the history of present illness.   All other ROS reviewed and  negative.     Physical Exam/Data:   Vitals:   12/28/19 0813 12/28/19 0900 12/28/19 1000 12/28/19 1010  BP: 110/69   (!) 105/53  Pulse: 85 (!) 111 81 82  Resp: 14 18 15 17   Temp:      TempSrc:      SpO2: 98% 100% 99% 100%  Weight:      Height:        Intake/Output Summary (Last 24 hours) at 12/28/2019 1058 Last data filed at 12/28/2019 0814 Gross per 24 hour  Intake 675.88 ml  Output 1000 ml  Net -  324.12 ml   Last 3 Weights 12/27/2019 12/26/2019 12/30/2015  Weight (lbs) 143 lb 4.8 oz 143 lb 4.8 oz 143 lb 5 oz  Weight (kg) 65 kg 65 kg 65.006 kg     Body mass index is 22.44 kg/m.  General:  Well nourished, well developed, in no acute distress HEENT: normal Neck: no JVD Vascular: No carotid bruits; FA pulses 2+ bilaterally  Cardiac:  normal S1, S2; RRR; no murmur  Lungs:  clear to auscultation bilaterally, no wheezing, rhonchi or rales  Abd: soft, nontender, no hepatomegaly  Ext: no edema Musculoskeletal:  No deformities, BUE and BLE strength normal and equal Skin: warm and dry  Neuro:  CNs 2-12 intact, no focal abnormalities noted Psych:  Normal affect   EKG:  The EKG was personally reviewed and demonstrates:  Sinus rhythm with diffuse ST elevation in precordial leads Telemetry:  Telemetry was personally reviewed and demonstrates:  Sinus rhythm in the 80's  Relevant CV Studies:  Echo pending  Laboratory Data:  High Sensitivity Troponin:  No results for input(s): TROPONINIHS in the last 720 hours.   Chemistry Recent Labs  Lab 12/27/19 1817 12/28/19 0028 12/28/19 0752  NA 131* 130* 132*  K 3.8 3.1* 3.9  CL 96* 97* 98  CO2 21* 19* 23  GLUCOSE 215* 227* 153*  BUN 17 19 18   CREATININE 0.84 1.00 0.68  CALCIUM 8.2* 8.5* 8.4*  GFRNONAA >60 >60 >60  GFRAA >60 >60 >60  ANIONGAP 14 14 11     Recent Labs  Lab 12/26/19 2152  PROT 9.9*  ALBUMIN 5.3*  AST 66*  ALT 44  ALKPHOS 100  BILITOT 1.9*   Hematology Recent Labs  Lab 12/26/19 2152 12/26/19 2218  WBC 30.7*   --   RBC 5.30  --   HGB 17.1* 17.3*  HCT 50.5 51.0  MCV 95.3  --   MCH 32.3  --   MCHC 33.9  --   RDW 12.1  --   PLT 554*  --    BNPNo results for input(s): BNP, PROBNP in the last 168 hours.  DDimer No results for input(s): DDIMER in the last 168 hours.   Radiology/Studies:  No results found.       Assessment and Plan:   Chest pain -Nurses noted ST elevation on telemetry, present since arrival. EKG showed significant ST elevations in precordial leads. We were asked to evaluate. -Pt with 1 week hx of mid chest/epigastic pain with eating/drinking. Also has shortness of breath.  -Pt with poorly controlled type 1 diabetes admitted with DKA.  -Echo showed EF 60%, concern for septal hypokinesis.  -Troponins pending.  -Will do cardiac cath today for definitive evaluation.   Diabetes type 1 since his teens -Poorly controlled.       For questions or updates, please contact [place] Please consult [email or web address] for contact info under     Signed, Daune Perch, NP  12/28/2019 10:58 AM

## 2019-12-28 NOTE — Progress Notes (Signed)
Pt is resting in bed comfortably at this time. Pt denies SOB and chest pain currently. Echo will be performed shortly; awaiting troponin results. VS stable. RN will continue to monitor.

## 2019-12-28 NOTE — Progress Notes (Signed)
CRITICAL VALUE ALERT  Critical Value:  Troponin 240  Date & Time Notied:  12/28/19, 1100  Provider Notified: Dr. Coral Else, cardiology  Orders Received/Actions taken: Pt is being transferred to [facility] for code STEMI.

## 2019-12-29 DIAGNOSIS — E1010 Type 1 diabetes mellitus with ketoacidosis without coma: Secondary | ICD-10-CM

## 2019-12-29 LAB — GLUCOSE, CAPILLARY
Glucose-Capillary: 278 mg/dL — ABNORMAL HIGH (ref 70–99)
Glucose-Capillary: 308 mg/dL — ABNORMAL HIGH (ref 70–99)

## 2019-12-29 MED ORDER — NOVOLOG FLEXPEN 100 UNIT/ML ~~LOC~~ SOPN: PEN_INJECTOR | SUBCUTANEOUS | 0 refills | Status: DC

## 2019-12-29 MED ORDER — LEVOTHYROXINE SODIUM 100 MCG PO TABS: 100.0000 ug | ORAL_TABLET | Freq: Every day | ORAL | Status: DC

## 2019-12-29 MED ORDER — INSULIN GLARGINE 100 UNIT/ML ~~LOC~~ SOLN
25.0000 [IU] | Freq: Every day | SUBCUTANEOUS | Status: DC
  Administered 2019-12-29: 11:00:00 25 [IU] via SUBCUTANEOUS
  Filled 2019-12-29: qty 0.25

## 2019-12-29 MED ORDER — LEVOTHYROXINE SODIUM 100 MCG PO TABS: 100.0000 ug | ORAL_TABLET | Freq: Every day | ORAL | 0 refills | Status: DC

## 2019-12-29 MED ORDER — LANTUS SOLOSTAR 100 UNIT/ML ~~LOC~~ SOPN: 25.0000 [IU] | PEN_INJECTOR | Freq: Every day | SUBCUTANEOUS | 0 refills | Status: DC

## 2019-12-29 NOTE — Progress Notes (Signed)
Inpatient Diabetes Program Recommendations  AACE/ADA: New Consensus Statement on Inpatient Glycemic Control (2015)  Target Ranges:  Prepandial:   less than 140 mg/dL      Peak postprandial:   less than 180 mg/dL (1-2 hours)      Critically ill patients:  140 - 180 mg/dL   Lab Results  Component Value Date   GLUCAP 308 (H) 12/29/2019   HGBA1C 10.5 (H) 12/27/2019      Diabetes history: DM1(does not make insulin.  Needs correction, basal and meal coverage) Outpatient Diabetes medications: Insulin pump-T-Slim-Basal 0.6/hr, correction coverage 1:55, Carb ratio 1:15, Target 110mg /dl Current orders for Inpatient glycemic control: Novolog 0-15 TID + 0-5 QHS + Novolog 4 units with meals + Lantus 25 units daily  Note:  Spoke with patient at bedside.  Reviewed T-Slim for pump settings.  Patient plans to do subcutaneous pen injections as his blood sugar has been uncontrolled with this pump.  His insurance has recently changed and he was not able to get into contact with his previous endocrinologist.  He plans to establish with a new endocrinologist.  He has a Dexcom and plans to restart his sensor when he establishes with his new endocrinologist.  He has a functioning glucose meter at home.  He has used insulin pen injections in the past and was on Lantus 26 unit daily and Novolog SSI and carbohydrate coverage.  He is aware of hypoglycemia and treatments.   The plan is for patient to discharge today.  No further questions or concerns at this time.  Thank you, , RN, BSN Diabetes Coordinator Inpatient Diabetes Program [phone] (team pager from 8a-5p)

## 2019-12-29 NOTE — Discharge Summary (Signed)
Physician Discharge Summary  Blake Sherman FAO:[id number] DOB: 03-26-1996 DOA: 12/26/2019  PCP: No primary care provider on file.  Admit date: 12/26/2019 Discharge date: 12/29/2019  Admitted From: Home Discharge disposition: Home   Code Status: Full Code  Diet Recommendation: Diabetic diet   Recommendations for Outpatient Follow-Up:   1. Follow-up with endocrinologist as an outpatient  Discharge Diagnosis:   Active Problems:   Autoimmune thyroiditis   DKA (diabetic ketoacidoses) ([place])    History of Present Illness / Brief narrative:  Patient is a 24 year old male who has type 1 diabetes and hypothyroidism since age of 63.  He was on insulin Lantus and NovoLog in the past but in the last 1 year he switched to insulin pump.  He was following up with endocrinologist Dr. Tamala Julian at [facility].  However he recently switched his job and insurance after which he has not been able to find a doctor. He states he was using insulin pump, but it failed to communicate with his glucometer, and when that happened there were communication issues with his doctor because of switching insurance. He decided to do nothing. He thinks it has been a couple weeks since his last dose of insulin.  3/2, he presented to [facility] ED with complaints of nausea/vomiting/malaise x3 days. His blood glucose levels over 700, VBG showed pH of 6.9, bicarb less than seven.   He was admitted to critical care and managed per DKA protocol. Incidentally, his troponin levels were also slightly elevated with concerning EKG changes. He underwent cardiac catheterization, normal coronary arteries found. Improved clinically and lab wise. Stable to go home today.  Hospital Course:  Diabetic ketoacidosis -secondary to malfunction of insulin pump and inadequate insulin.   -Came with hyperglycemia, pH of 6.9, low serum bicarb and elevated beta hydroxybutyrate level.   -Managed in ICU with DKA protocol, including  insulin drip, IV bicarbonate drip as well as electrolyte replacement as needed.   -He was subsequently switched to subcutaneous Lantus.  He no longer has nausea vomiting.   -No evidence of infection.  Tolerating diet.    Type 1 diabetes mellitus -A1c 10.5 on 3/3. -Has type 1 diabetes since the age of 81.   -Previously on Lantus 26 units nightly and NovoLog sliding scale as needed.  And he says he was able to maintain A1c less than eight. -Lately on insulin pump but since it seems to have malfunction, patient wants to switch back to Lantus and sliding scale insulin for the time being.   -He was given 18 units of Lantus yesterday.  Noted that blood sugar is still hovering more than 200 mostly.  This morning, I gave him Lantus 25 units. -I also discussed on the phone with his sister who is a Editor, commissioning.  -I will print paper prescription for him.  I encouraged him to try [facility] as well as was long outpatient pharmacy which have more affordable versions of insulin. -Patient is in process of finding a new endocrinologist under his insurance plan.  Hypothyroidism History of autoimmune thyroiditis -TSH of 94.0 compared to 14.0- 4 years back.  -Patient is not sure how much of Synthroid he was supposed to be on, he is not sure but thinks it was 75 mcg.  -Here, he has been started on 100 mcg of Synthroid.  I will discharge him on the same.  -I highly encouraged him to have a follow-up arranged with endocrinologist.  Continue to monitor TSH as an outpatient.  Elevated troponin -Troponin elevated to 240.  EKG also showed possible ST elevation changes. -Cardiology consultation was obtained. -Patient underwent cardiac cath on 3/4 that showed normal coronaries.  Stable for discharge to home today.  Subjective:  Seen and examined this morning.  Pleasant young male.  Not in distress.  No nausea vomiting  Discharge Exam:   Vitals:   12/28/19 1430 12/28/19 1500 12/28/19 2019 12/29/19 0659  BP:  (!) 97/55   108/63  Pulse: 83   62  Resp: 15     Temp: 97.9 F (36.6 C)  98.1 F (36.7 C) 98 F (36.7 C)  TempSrc: Oral  Oral Oral  SpO2: 98% 100%  100%  Weight: 56.8 kg   56.4 kg  Height: 5\' 7"  (1.702 m)       Body mass index is 19.47 kg/m.  General exam: Appears calm and comfortable.  Skin: No rashes, lesions or ulcers. HEENT: Atraumatic, normocephalic, supple neck, no obvious bleeding Lungs: Clear to auscultation bilaterally CVS: Regular rate and rhythm, no murmur GI/Abd soft, nontender, nondistended, bowel sound present CNS: Alert, awake amount of x3 Psychiatry: Mood appropriate Extremities: No pedal edema, no calf tenderness  Discharge Instructions:  Wound care: None Discharge Instructions    Ambulatory referral to Endocrinology   Complete by: As directed    Type 1 diabetes mellitus   Call MD for:  extreme fatigue   Complete by: As directed    Call MD for:  persistant dizziness or light-headedness   Complete by: As directed    Diet Carb Modified   Complete by: As directed    Increase activity slowly   Complete by: As directed      Follow-up Information    primary care provider. Schedule an appointment as soon as possible for a visit in 2 week(s).        , MD Follow up.   Specialty: Internal Medicine Why: for insulin pump .diabetes followup Contact information: [address] [place] [phone]          Allergies as of 12/29/2019   No Known Allergies     Medication List    STOP taking these medications   HYDROcodone-acetaminophen 5-325 MG tablet Commonly known as: NORCO/VICODIN   ibuprofen 800 MG tablet Commonly known as: ADVIL   methocarbamol 500 MG tablet Commonly known as: ROBAXIN     TAKE these medications   Accu-Chek FastClix Lancets Misc USE TO TEST BLOOD SUGAR SEVEN TIMES EVERY DAY   Accu-Chek SmartView test strip Generic drug: glucose blood USE AS DIRECTED TO CHEEK BLOOD  SUGAR SIX TIMES DAILY   acetone (urine) test strip Check ketones per protocol   BD Pen Needle Nano U/F 32G X 4 MM Misc Generic drug: Insulin Pen Needle USE TO INJECT INSULIN SIX TIMES DAILY   glucagon 1 MG injection Use for Severe Hypoglycemia . Inject 1 mg intramuscularly if unresponsive, unable to swallow, unconscious and/or has seizure   Lantus SoloStar 100 UNIT/ML Solostar Pen Generic drug: insulin glargine Inject 25 Units into the skin daily. What changed: See the new instructions.   levothyroxine 100 MCG tablet Commonly known as: SYNTHROID Take 1 tablet (100 mcg total) by mouth daily at 6 (six) AM. Start taking on: December 30, 2019 What changed:   medication strength  See the new instructions.   NovoLOG FlexPen 100 UNIT/ML FlexPen Generic drug: insulin aspart 0-15 Units, Subcutaneous, 3 times daily with meals CBG < 70: implement hypoglycemia protocol-call MD CBG  70 - 120: 0 units CBG 121 - 150: 2 units CBG 151 - 200: 3 units CBG 201 - 250: 5 units CBG 251 - 300: 8 units CBG 301 - 350: 11 units CBG 351 - 400: 15 units CBG > 400: What changed: See the new instructions.       Time coordinating discharge: 35 minutes  The results of significant diagnostics from this hospitalization (including imaging, microbiology, ancillary and laboratory) are listed below for reference.    Procedures and Diagnostic Studies:   No results found.   Labs:   Basic Metabolic Panel: Recent Labs  Lab 12/27/19 0220 12/27/19 0635 12/27/19 1021 12/27/19 1021 12/27/19 1514 12/27/19 1514 12/27/19 1817 12/27/19 1817 12/28/19 0028 12/28/19 0752 12/28/19 1455  NA 126*   < > 130*  --  132*  --  131*  --  130* 132*  --   K 4.5   < > 3.7   < > 3.4*   < > 3.8   < > 3.1* 3.9  --   CL 96*   < > 101  --  98  --  96*  --  97* 98  --   CO2 7*   < > 20*  --  23  --  21*  --  19* 23  --   GLUCOSE 445*   < > 193*  --  144*  --  215*  --  227* 153*  --   BUN 33*   < > 22*  --  18  --  17   --  19 18  --   CREATININE 1.42*   < > 0.87   < > 0.84  --  0.84  --  1.00 0.68 0.82  CALCIUM 8.0*   < > 8.0*  --  8.1*  --  8.2*  --  8.5* 8.4*  --   MG 2.0  --   --   --   --   --   --   --   --   --   --   PHOS 2.4*  --   --   --   --   --   --   --   --   --   --    < > = values in this interval not displayed.   GFR Estimated Creatinine Clearance: 112.7 mL/min (by C-G formula based on SCr of 0.82 mg/dL). Liver Function Tests: Recent Labs  Lab 12/26/19 2152  AST 66*  ALT 44  ALKPHOS 100  BILITOT 1.9*  PROT 9.9*  ALBUMIN 5.3*   Recent Labs  Lab 12/26/19 2152  LIPASE 23   No results for input(s): AMMONIA in the last 168 hours. Coagulation profile No results for input(s): INR, PROTIME in the last 168 hours.  CBC: Recent Labs  Lab 12/26/19 2152 12/26/19 2218 12/28/19 1455  WBC 30.7*  --  8.5  NEUTROABS 24.9*  --   --   HGB 17.1* 17.3* 13.6  HCT 50.5 51.0 37.7*  MCV 95.3  --  87.9  PLT 554*  --  247   Cardiac Enzymes: No results for input(s): CKTOTAL, CKMB, CKMBINDEX, TROPONINI in the last 168 hours. BNP: Invalid input(s): POCBNP CBG: Recent Labs  Lab 12/28/19 1228 12/28/19 1329 12/28/19 1640 12/28/19 2157 12/29/19 0812  GLUCAP 200* 154* 218* 321* 278*   D-Dimer No results for input(s): DDIMER in the last 72 hours. Hgb A1c Recent Labs    12/27/19 0220  HGBA1C 10.5*  Lipid Profile No results for input(s): CHOL, HDL, LDLCALC, TRIG, CHOLHDL, LDLDIRECT in the last 72 hours. Thyroid function studies Recent Labs    12/27/19 0220  TSH 94.291*   Anemia work up No results for input(s): VITAMINB12, FOLATE, FERRITIN, TIBC, IRON, RETICCTPCT in the last 72 hours. Microbiology Recent Results (from the past 240 hour(s))  SARS CORONAVIRUS 2 (TAT 6-24 HRS) Nasopharyngeal Nasopharyngeal Swab     Status: None   Collection Time: 12/27/19  1:05 AM   Specimen: Nasopharyngeal Swab  Result Value Ref Range Status   SARS Coronavirus 2 NEGATIVE NEGATIVE Final     Comment: (NOTE) SARS-CoV-2 target nucleic acids are NOT DETECTED. The SARS-CoV-2 RNA is generally detectable in upper and lower respiratory specimens during the acute phase of infection. Negative results do not preclude SARS-CoV-2 infection, do not rule out co-infections with other pathogens, and should not be used as the sole basis for treatment or other patient management decisions. Negative results must be combined with clinical observations, patient history, and epidemiological information. The expected result is Negative. Fact Sheet for Patients: HairSlick.no Fact Sheet for Healthcare Providers: quierodirigir.com This test is not yet approved or cleared by the [place] FDA and  has been authorized for detection and/or diagnosis of SARS-CoV-2 by FDA under an Emergency Use Authorization (EUA). This EUA will remain  in effect (meaning this test can be used) for the duration of the COVID-19 declaration under Section 56 4(b)(1) of the Act, 21 U.S.C. section 360bbb-3(b)(1), unless the authorization is terminated or revoked sooner. Performed at [facility], [address] [place], [place] [address]   MRSA PCR Screening     Status: Abnormal   Collection Time: 12/27/19  5:04 AM   Specimen: Nasal Mucosa; Nasopharyngeal  Result Value Ref Range Status   MRSA by PCR POSITIVE (A) NEGATIVE Final    Comment:        The GeneXpert MRSA Assay (FDA approved for NASAL specimens only), is one component of a comprehensive MRSA colonization surveillance program. It is not intended to diagnose MRSA infection nor to guide or monitor treatment for MRSA infections. RESULT CALLED TO, READ BACK BY AND VERIFIED WITH: L.CAUDLE RN AT 1203 ON 12/27/19 BY S.VANHOORNE Performed at [facility], [address] [place], [place] [address]     Please note: You were cared for by a hospitalist during your hospital stay. Once  you are discharged, your primary care physician will handle any further medical issues. Please note that NO REFILLS for any discharge medications will be authorized once you are discharged, as it is imperative that you return to your primary care physician (or establish a relationship with a primary care physician if you do not have one) for your post hospital discharge needs so that they can reassess your need for medications and monitor your lab values.  Signed: Melina Schools Anntionette Madkins  [facility] 12/29/2019, 11:50 AM

## 2019-12-29 NOTE — Discharge Instructions (Signed)
Diabetic Ketoacidosis Diabetic ketoacidosis is a serious complication of diabetes. This condition develops when there is not enough insulin in the body. Insulin is an hormone that regulates blood sugar levels in the body. Normally, insulin allows glucose to enter the cells in the body. The cells break down glucose for energy. Without enough insulin, the body cannot break down glucose, so it breaks down fats instead. This leads to high blood glucose levels in the body and the production of acids that are called ketones. Ketones are poisonous at high levels. If diabetic ketoacidosis is not treated, it can cause severe dehydration and can lead to a coma or death. What are the causes? This condition develops when a lack of insulin causes the body to break down fats instead of glucose. This may be triggered by:  Stress on the body. This stress is brought on by an illness.  Infection.  Medicines that raise blood glucose levels.  Not taking diabetes medicine.  New onset of type 1 diabetes mellitus. What are the signs or symptoms? Symptoms of this condition include:  Fatigue.  Weight loss.  Excessive thirst.  Light-headedness.  Fruity or sweet-smelling breath.  Excessive urination.  Vision changes.  Confusion or irritability.  Nausea.  Vomiting.  Rapid breathing.  Abdominal pain.  Feeling flushed. How is this diagnosed? This condition is diagnosed based on your medical history, a physical exam, and blood tests. You may also have a urine test to check for ketones. How is this treated? This condition may be treated with:  Fluid replacement. This may be done to correct dehydration.  Insulin injections. These may be given through the skin or through an IV tube.  Electrolyte replacement. Electrolytes are minerals in your blood. Electrolytes such as potassium and sodium may be given in pill form or through an IV tube.  Antibiotic medicines. These may be prescribed if your  condition was caused by an infection. Diabetic ketoacidosis is a serious medical condition. You may need emergency treatment in the hospital to monitor your condition. Follow these instructions at home: Eating and drinking  Drink enough fluids to keep your urine clear or pale yellow.  If you are not able to eat, drink clear fluids in small amounts as you are able. Clear fluids include water, ice chips, fruit juice with water added (diluted), and low-calorie sports drinks. You may also have sugar-free jello or popsicles.  If you are able to eat, follow your usual diet and drink sugar-free liquids, such as water. Medicines  Take over-the-counter and prescription medicines only as told by your health care provider.  Continue to take insulin and other diabetes medicines as told by your health care provider.  If you were prescribed an antibiotic, take it as told by your health care provider. Do not stop taking the antibiotic even if you start to feel better. General instructions   Check your urine for ketones when you are ill and as told by your health care provider. ? If your blood glucose is 240 mg/dL (13.3 mmol/L) or higher, check your urine ketones every 4-6 hours.  Check your blood glucose every day, as often as told by your health care provider. ? If your blood glucose is high, drink plenty of fluids. This helps to flush out ketones. ? If your blood glucose is above your target for 2 tests in a row, contact your health care provider.  Carry a medical alert card or wear medical alert jewelry that says that you have diabetes.  Rest   and exercise only as told by your health care provider. Do not exercise when your blood glucose is high and you have ketones in your urine.  If you get sick, call your health care provider and begin treatment quickly. Your body often needs extra insulin to fight an illness. Check your blood glucose every 4-6 hours when you are sick.  Keep all follow-up  visits as told by your health care provider. This is important. Contact a health care provider if:  Your blood glucose level is higher than 240 mg/dL (81.0 mmol/L) for 2 days in a row.  You have moderate or large ketones in your urine.  You have a fever.  You cannot eat or drink without vomiting.  You have been vomiting for more than 2 hours.  You continue to have symptoms of diabetic ketoacidosis.  You develop new symptoms. Get help right away if:  Your blood glucose monitor reads "high" even when you are taking insulin.  You faint.  You have chest pain.  You have trouble breathing.  You have sudden trouble speaking or swallowing.  You have vomiting or diarrhea that gets worse after 3 hours.  You are unable to stay awake.  You have trouble thinking.  You are severely dehydrated. Symptoms of severe dehydration include: ? Extreme thirst. ? Dry mouth. ? Rapid breathing. These symptoms may represent a serious problem that is an emergency. Do not wait to see if the symptoms will go away. Get medical help right away. Call your local emergency services (911 in the [place] Do not drive yourself to the hospital. Summary  Diabetic ketoacidosis is a serious complication of diabetes. This condition develops when there is not enough insulin in the body.  This condition is diagnosed based on your medical history, a physical exam, and blood tests. You may also have a urine test to check for ketones.  Diabetic ketoacidosis is a serious medical condition. You may need emergency treatment in the hospital to monitor your condition.  Contact your health care provider if your blood glucose is higher than 240 mg/dl for 2 days in a row or if you have moderate or large ketones in your urine. This information is not intended to replace advice given to you by your health care provider. Make sure you discuss any questions you have with your health care provider. Document Revised: 11/27/2016  Document Reviewed: 11/16/2016 Elsevier Patient Education  2020 [employer]   Radial Site Care  This sheet gives you information about how to care for yourself after your procedure. Your health care provider may also give you more specific instructions. If you have problems or questions, contact your health care provider. What can I expect after the procedure? After the procedure, it is common to have:  Bruising and tenderness at the catheter insertion area. Follow these instructions at home: Medicines  Take over-the-counter and prescription medicines only as told by your health care provider. Insertion site care  Follow instructions from your health care provider about how to take care of your insertion site. Make sure you: ? Wash your hands with soap and water before you change your bandage (dressing). If soap and water are not available, use hand sanitizer. ? Change your dressing as told by your health care provider. ? Leave stitches (sutures), skin glue, or adhesive strips in place. These skin closures may need to stay in place for 2 weeks or longer. If adhesive strip edges start to loosen and curl up, you may trim the  loose edges. Do not remove adhesive strips completely unless your health care provider tells you to do that.  Check your insertion site every day for signs of infection. Check for: ? Redness, swelling, or pain. ? Fluid or blood. ? Pus or a bad smell. ? Warmth.  Do not take baths, swim, or use a hot tub until your health care provider approves.  You may shower 24-48 hours after the procedure, or as directed by your health care provider. ? Remove the dressing and gently wash the site with plain soap and water. ? Pat the area dry with a clean towel. ? Do not rub the site. That could cause bleeding.  Do not apply powder or lotion to the site. Activity   For 24 hours after the procedure, or as directed by your health care provider: ? Do not flex or bend the  affected arm. ? Do not push or pull heavy objects with the affected arm. ? Do not drive yourself home from the hospital or clinic. You may drive 24 hours after the procedure unless your health care provider tells you not to. ? Do not operate machinery or power tools.  Do not lift anything that is heavier than 10 lb (4.5 kg), or the limit that you are told, until your health care provider says that it is safe.  Ask your health care provider when it is okay to: ? Return to work or school. ? Resume usual physical activities or sports. ? Resume sexual activity. General instructions  If the catheter site starts to bleed, raise your arm and put firm pressure on the site. If the bleeding does not stop, get help right away. This is a medical emergency.  If you went home on the same day as your procedure, a responsible adult should be with you for the first 24 hours after you arrive home.  Keep all follow-up visits as told by your health care provider. This is important. Contact a health care provider if:  You have a fever.  You have redness, swelling, or yellow drainage around your insertion site. Get help right away if:  You have unusual pain at the radial site.  The catheter insertion area swells very fast.  The insertion area is bleeding, and the bleeding does not stop when you hold steady pressure on the area.  Your arm or hand becomes pale, cool, tingly, or numb. These symptoms may represent a serious problem that is an emergency. Do not wait to see if the symptoms will go away. Get medical help right away. Call your local emergency services (911 in the [place] Do not drive yourself to the hospital. Summary  After the procedure, it is common to have bruising and tenderness at the site.  Follow instructions from your health care provider about how to take care of your radial site wound. Check the wound every day for signs of infection.  Do not lift anything that is heavier than 10  lb (4.5 kg), or the limit that you are told, until your health care provider says that it is safe. This information is not intended to replace advice given to you by your health care provider. Make sure you discuss any questions you have with your health care provider. Document Revised: 11/17/2017 Document Reviewed: 11/17/2017 Elsevier Patient Education  2020 [employer].

## 2019-12-29 NOTE — Progress Notes (Signed)
Cardiology Progress Note  Patient ID: Blake Sherman MRN: [id number] DOB: [birth year] Date of Encounter: 12/29/2019  Primary Cardiologist: No primary care provider on file.  Subjective  Left heart cath normal.  Eating improved.  CT PE study negative.  Suspect his EKG changes were just baseline early repole that were slightly worse in the setting of hyperkalemia with DKA.  ROS:  All other ROS reviewed and negative. Pertinent positives noted in the HPI.     Inpatient Medications  Scheduled Meds: . aspirin  81 mg Oral Pre-Cath  . aspirin  325 mg Oral Daily  . Chlorhexidine Gluconate Cloth  6 each Topical Daily  . heparin  5,000 Units Subcutaneous Q8H  . insulin aspart  0-15 Units Subcutaneous TID WC  . insulin aspart  0-5 Units Subcutaneous QHS  . insulin aspart  4 Units Subcutaneous TID WC  . insulin glargine  25 Units Subcutaneous Daily  . levothyroxine  100 mcg Oral Q0600  . levothyroxine  25 mcg Oral Q0600  . mupirocin ointment  1 application Nasal BID  . sodium chloride flush  3 mL Intravenous Q12H  . sodium chloride flush  3 mL Intravenous Q12H   Continuous Infusions: . sodium chloride    . sodium chloride     PRN Meds: sodium chloride, sodium chloride, acetaminophen, alum & mag hydroxide-simeth, dextrose, ondansetron (ZOFRAN) IV, sodium chloride flush, sodium chloride flush   Vital Signs   Vitals:   12/28/19 1430 12/28/19 1500 12/28/19 2019 12/29/19 0659  BP: (!) 97/55   108/63  Pulse: 83   62  Resp: 15     Temp: 97.9 F (36.6 C)  98.1 F (36.7 C) 98 F (36.7 C)  TempSrc: Oral  Oral Oral  SpO2: 98% 100%  100%  Weight: 56.8 kg   56.4 kg  Height: '5\' 7"'  (1.702 m)       Intake/Output Summary (Last 24 hours) at 12/29/2019 0905 Last data filed at 12/28/2019 1649 Gross per 24 hour  Intake 1104.32 ml  Output --  Net 1104.32 ml   Last 3 Weights 12/29/2019 12/28/2019 12/27/2019  Weight (lbs) 124 lb 4.8 oz 125 lb 3.2 oz 143 lb 4.8 oz  Weight (kg) 56.382 kg 56.79 kg 65  kg      Telemetry  Overnight telemetry shows normal sinus rhythm heart rate in the 70s, which I personally reviewed.   ECG  The most recent ECG shows normal sinus rhythm heart rate 64, ST elevation noted in the anterolateral leads likely consistent with early repolarization abnormality, which I personally reviewed.   Physical Exam   Vitals:   12/28/19 1430 12/28/19 1500 12/28/19 2019 12/29/19 0659  BP: (!) 97/55   108/63  Pulse: 83   62  Resp: 15     Temp: 97.9 F (36.6 C)  98.1 F (36.7 C) 98 F (36.7 C)  TempSrc: Oral  Oral Oral  SpO2: 98% 100%  100%  Weight: 56.8 kg   56.4 kg  Height: '5\' 7"'  (1.702 m)        Intake/Output Summary (Last 24 hours) at 12/29/2019 0905 Last data filed at 12/28/2019 1649 Gross per 24 hour  Intake 1104.32 ml  Output --  Net 1104.32 ml    Last 3 Weights 12/29/2019 12/28/2019 12/27/2019  Weight (lbs) 124 lb 4.8 oz 125 lb 3.2 oz 143 lb 4.8 oz  Weight (kg) 56.382 kg 56.79 kg 65 kg    Body mass index is 19.47 kg/m.  General: Well nourished, well developed,  in no acute distress Head: Atraumatic, normal size  Eyes: PEERLA, EOMI  Neck: Supple, no JVD Endocrine: No thryomegaly Cardiac: Normal S1, S2; RRR; no murmurs, rubs, or gallops Lungs: Clear to auscultation bilaterally, no wheezing, rhonchi or rales  Abd: Soft, nontender, no hepatomegaly  Ext: No edema, pulses 2+, right radial cath site clean with good pulse Musculoskeletal: No deformities, BUE and BLE strength normal and equal Skin: Warm and dry, no rashes   Neuro: Alert and oriented to person, place, time, and situation, CNII-XII grossly intact, no focal deficits  Psych: Normal mood and affect   Labs  High Sensitivity Troponin:   Recent Labs  Lab 12/28/19 1027  TROPONINIHS 240*     Cardiac EnzymesNo results for input(s): TROPONINI in the last 168 hours. No results for input(s): TROPIPOC in the last 168 hours.  Chemistry Recent Labs  Lab 12/26/19 2152 12/26/19 2218 12/27/19 1817  12/27/19 1817 12/28/19 0028 12/28/19 0752 12/28/19 1455  NA 120*   < > 131*  --  130* 132*  --   K 5.3*   < > 3.8  --  3.1* 3.9  --   CL 81*   < > 96*  --  97* 98  --   CO2 <7*   < > 21*  --  19* 23  --   GLUCOSE 724*   < > 215*  --  227* 153*  --   BUN 39*   < > 17  --  19 18  --   CREATININE 1.86*   < > 0.84   < > 1.00 0.68 0.82  CALCIUM 9.0   < > 8.2*  --  8.5* 8.4*  --   PROT 9.9*  --   --   --   --   --   --   ALBUMIN 5.3*  --   --   --   --   --   --   AST 66*  --   --   --   --   --   --   ALT 44  --   --   --   --   --   --   ALKPHOS 100  --   --   --   --   --   --   BILITOT 1.9*  --   --   --   --   --   --   GFRNONAA 50*   < > >60   < > >60 >60 >60  GFRAA 58*   < > >60   < > >60 >60 >60  ANIONGAP NOT CALCULATED   < > 14  --  14 11  --    < > = values in this interval not displayed.    Hematology Recent Labs  Lab 12/26/19 2152 12/26/19 2218 12/28/19 1455  WBC 30.7*  --  8.5  RBC 5.30  --  4.29  HGB 17.1* 17.3* 13.6  HCT 50.5 51.0 37.7*  MCV 95.3  --  87.9  MCH 32.3  --  31.7  MCHC 33.9  --  36.1*  RDW 12.1  --  13.2  PLT 554*  --  247   BNPNo results for input(s): BNP, PROBNP in the last 168 hours.  DDimer No results for input(s): DDIMER in the last 168 hours.   Radiology  CT ANGIO CHEST PE W OR WO CONTRAST  Result Date: 12/28/2019 CLINICAL DATA:  PE suspected, high prob chest pain, sob, elevated  troponins EXAM: CT ANGIOGRAPHY CHEST WITH CONTRAST TECHNIQUE: Multidetector CT imaging of the chest was performed using the standard protocol during bolus administration of intravenous contrast. Multiplanar CT image reconstructions and MIPs were obtained to evaluate the vascular anatomy. CONTRAST:  175m OMNIPAQUE IOHEXOL 350 MG/ML SOLN COMPARISON:  None. No prior chest imaging. FINDINGS: Cardiovascular: There are no filling defects within the pulmonary arteries to suggest pulmonary embolus. The thoracic aorta is normal in caliber. Cannot assess for dissection given  phase of contrast tailored to pulmonary artery evaluation. Heart is normal in size. No pericardial effusion. Scattered linear air in the anterior neck felt to be related to IV catheter placement. Mediastinum/Nodes: No adenopathy, detailed evaluation limited given paucity of mediastinal fat. No esophageal wall thickening. No visualized thyroid nodule. Lungs/Pleura: Mild motion artifact in the lower lobes. Minimal dependent atelectasis at the lung bases. The lungs are otherwise clear. No pleural fluid. No pulmonary edema. Trachea and mainstem bronchi are patent. Upper Abdomen: Excreted IV contrast in both renal collecting systems. Musculoskeletal: There are no acute or suspicious osseous abnormalities. Slight endplate spurring in the upper thoracic spine with minimal loss of height of superior endplate of T4, likely chronic. Review of the MIP images confirms the above findings. IMPRESSION: No pulmonary embolus or acute intrathoracic abnormality. Electronically Signed   By: MKeith RakeM.D.   On: 12/28/2019 14:49   CARDIAC CATHETERIZATION  Result Date: 12/28/2019  The left ventricular systolic function is normal.  LV end diastolic pressure is low.  The left ventricular ejection fraction is 55-65% by visual estimate.  There is no aortic valve stenosis.  No angiographically apparent coronary artery disease.  Continue with preventive therapy and DM management. D/w Dr. OAudie Box  Plan is for CTA to r/o PE.  Minimal dye used for this procedure.   ECHOCARDIOGRAM COMPLETE  Result Date: 12/28/2019    ECHOCARDIOGRAM REPORT   Patient Name:   Blake Sherman Date of Exam: 12/28/2019 Medical Rec #:  [id number]           Height:       67.0 in Accession #:    [id number]          Weight:       143.3 lb Date of Birth:  [birth year]           BSA:          1.755 m Patient Age:    24 years             BP:           90/55 mmHg Patient Gender: M                    HR:           81 bpm. Exam Location:  Inpatient Procedure:  2D Echo        STAT ECHO  Dr. OAudie Boxin the room. Indications:    Abnormal ECG R94.31  History:        Patient has no prior history of Echocardiogram examinations.                 Risk Factors:Diabetes.  Sonographer:    CMikki SanteeRDCS (AE) Referring Phys: [id number] 1. There is intermittent right to left diastolic displacement of the interventricular septum, suggesting enhanced ventricular interdependence. There are no other findings to suggest constrctive physiology.. Left ventricular ejection fraction, by estimation, is 50 to 55%. The left ventricle has  low normal function. The left ventricle has no regional wall motion abnormalities. There is mild concentric left ventricular hypertrophy. Left ventricular diastolic parameters are consistent with Grade I diastolic dysfunction (impaired relaxation). Annular diastolic velocities are abnormally low for age.  2. Right ventricular systolic function is mildly reduced. The right ventricular size is mildly enlarged. There is normal pulmonary artery systolic pressure.  3. The mitral valve is normal in structure and function. No evidence of mitral valve regurgitation.  4. The aortic valve is normal in structure and function. Aortic valve regurgitation is not visualized. FINDINGS  Left Ventricle: There is intermittent right to left diastolic displacement of the interventricular septum, suggesting enhanced ventricular interdependence. There are no other findings to suggest constrctive physiology. Left ventricular ejection fraction, by estimation, is 50 to 55%. The left ventricle has low normal function. The left ventricle has no regional wall motion abnormalities. The left ventricular internal cavity size was normal in size. There is mild concentric left ventricular hypertrophy. Left ventricular diastolic parameters are consistent with Grade I diastolic dysfunction (impaired relaxation). Normal left ventricular filling pressure. Right  Ventricle: The right ventricular size is mildly enlarged. No increase in right ventricular wall thickness. Right ventricular systolic function is mildly reduced. There is normal pulmonary artery systolic pressure. The tricuspid regurgitant velocity  is 2.04 m/s, and with an assumed right atrial pressure of 3 mmHg, the estimated right ventricular systolic pressure is 31.5 mmHg. Left Atrium: Left atrial size was normal in size. Right Atrium: Right atrial size was normal in size. Pericardium: Trivial pericardial effusion is present. Mitral Valve: The mitral valve is normal in structure and function. No evidence of mitral valve regurgitation. Tricuspid Valve: The tricuspid valve is normal in structure. Tricuspid valve regurgitation is mild. Aortic Valve: The aortic valve is normal in structure and function. Aortic valve regurgitation is not visualized. Pulmonic Valve: The pulmonic valve was normal in structure. Pulmonic valve regurgitation is not visualized. Aorta: The aortic root and ascending aorta are structurally normal, with no evidence of dilitation. IAS/Shunts: No atrial level shunt detected by color flow Doppler.  LEFT VENTRICLE PLAX 2D LVIDd:         4.60 cm     Diastology LV PW:         1.20 cm     LV e' lateral:   10.80 cm/s LV IVS:        1.30 cm     LV E/e' lateral: 4.6 LVOT diam:     2.20 cm     LV e' medial:    7.72 cm/s LV SV:         49          LV E/e' medial:  6.5 LV SV Index:   28 LVOT Area:     3.80 cm  LV Volumes (MOD) LV vol d, MOD A2C: 77.0 ml LV vol d, MOD A4C: 93.2 ml LV vol s, MOD A2C: 38.0 ml LV vol s, MOD A4C: 46.1 ml LV SV MOD A2C:     39.0 ml LV SV MOD A4C:     93.2 ml LV SV MOD BP:      43.3 ml RIGHT VENTRICLE RV S prime:     7.35 cm/s TAPSE (M-mode): 1.2 cm LEFT ATRIUM             Index       RIGHT ATRIUM           Index LA Vol (A2C):   13.5 ml 7.69 ml/m  RA Area:     16.40 cm LA Vol (A4C):   20.7 ml 11.79 ml/m RA Volume:   50.10 ml  28.55 ml/m LA Biplane Vol: 17.9 ml 10.20 ml/m   AORTIC VALVE LVOT Vmax:   80.30 cm/s LVOT Vmean:  59.200 cm/s LVOT VTI:    0.130 m  AORTA Ao Root diam: 2.80 cm MITRAL VALVE               TRICUSPID VALVE MV Area (PHT): 5.42 cm    TR Peak grad:   16.6 mmHg MV Decel Time: 140 msec    TR Vmax:        204.00 cm/s MV E velocity: 50.20 cm/s MV A velocity: 61.30 cm/s  SHUNTS MV E/A ratio:  0.82        Systemic VTI:  0.13 m                            Systemic Diam: 2.20 cm Mihai Croitoru MD Electronically signed by Sanda Klein MD Signature Date/Time: 12/28/2019/2:32:44 PM    Final     Cardiac Studies  Echocardiogram -> ejection fraction 55%, mildly dilated right RV  Left heart cath -> normal coronary arteries  Patient Profile  Blake Sherman is a 24 year old male with history of type 1 diabetes who was admitted chest pain and shortness of breath with hyperglycemia and found to have DKA.  This morning the hospital medicine team was concerned for ST elevation on the monitor.   Assessment & Plan   1.  Shortness of breath/chest pain/ST elevation -Left heart cath with normal coronaries.  Echocardiogram with preserved EF and mildly dilated RV.  I suspect the changes seen on his echo as well as enhanced ventricular interdependence are related to aggressive volume resuscitation in a young healthy person. -His EKG did have concerns for injury pattern yesterday.  Left heart cath normal. -His EKG today demonstrates what is likely early repolarization abnormality.  I suspect his changes were enhanced in the setting of hyperkalemia from DKA. -His chest pain is mainly occurring when he eats.  I suspect this is GERD.  I did check an ESR as well as CRP.  They were normal.  He does not have symptoms of pericarditis.  I encouraged him to treat his acid reflux. -He also was short of breath and given his mildly dilated RV, we did pursue a CT PE study this was negative.  I suspect these changes were just related to aggressive volume resuscitation in a young healthy male.  2.   Post cardiac cath care -Right radial site looks good.  I have informed him that he should have no heavy lifting (15 pounds or less) with his right arm for the next 5 to 7 days.  He may resume driving 5 days post cath which will be around Monday.  He does work in a [employer] and have informed him that he can return to work on Monday but should not lift anything heavier than 15 pounds until at least next Wednesday.  He is in agreement with this plan.  [facility] will sign off.   Medication Recommendations: No specific medical treatment is recommended Other recommendations (labs, testing, etc): None Follow up as an outpatient: He is not a follow-up as an outpatient.  I have gone over his return to work precautions as well as when he will be able to drive.  This is detailed above.  Please reiterate this and  his discharge summary.  For questions or updates, please contact Agency Please consult [email or web address] for contact info under   Signed, [place] T. Audie Box, [place]  12/29/2019 9:05 AM

## 2020-03-15 ENCOUNTER — Ambulatory Visit: Payer: 59 | Admitting: Internal Medicine

## 2020-03-15 DIAGNOSIS — Z0289 Encounter for other administrative examinations: Secondary | ICD-10-CM

## 2020-03-15 NOTE — Progress Notes (Deleted)
Name: Blake Sherman  MRN/ DOB: [id number], 31-Oct-1995   Age/ Sex: 24 y.o., male    PCP: No primary care provider on file.   Reason for Endocrinology Evaluation: Type 1 Diabetes Mellitus     Date of Initial Endocrinology Visit: 03/15/2020     PATIENT IDENTIFIER: Blake Sherman is a 25 y.o. male with a past medical history of T1DM and hypothyroidism. The patient presented for initial endocrinology clinic visit on 03/15/2020 for consultative assistance with his diabetes management.    HPI: Blake Sherman was    Diagnosed with T1DM in 04/2013 Prior Medications tried/Intolerance: *** Currently checking blood sugars *** x / day,  before breakfast and ***.  Hypoglycemia episodes : ***               Symptoms: ***                 Frequency: ***/  Hemoglobin A1c has ranged from 7.5% in 2015, peaking at 10.6% in 2016. Patient required assistance for hypoglycemia:  Patient has required hospitalization within the last 1 year from hyper or hypoglycemia:   In terms of diet, the patient ***   HOME DIABETES REGIMEN: Basal: ***  Bolus: ***   Statin: {Yes/No:11203} ACE-I/ARB: {YES/NO:17245} Prior Diabetic Education: {Yes/No:11203}   METER DOWNLOAD SUMMARY: Date range evaluated: *** Fingerstick Blood Glucose Tests = *** Average Number Tests/Day = *** Overall Mean FS Glucose = *** Standard Deviation = ***  BG Ranges: Low = *** High = ***   Hypoglycemic Events/30 Days: BG < 50 = *** Episodes of symptomatic severe hypoglycemia = ***   DIABETIC COMPLICATIONS: Microvascular complications:   ***  Denies: CKD  Last eye exam: Completed   Macrovascular complications:   ***  Denies: CAD, PVD, CVA   PAST HISTORY: Past Medical History:  Past Medical History:  Diagnosis Date  . Diabetes mellitus without complication [facility])    diagnosed July 2014  . Hypothyroidism     Past Surgical History:  Past Surgical History:  Procedure Laterality Date  . LEFT HEART CATH  AND CORONARY ANGIOGRAPHY N/A 12/28/2019   Procedure: LEFT HEART CATH AND CORONARY ANGIOGRAPHY;  Surgeon: Corky Crafts, MD;  Location: [facility] INVASIVE CV LAB;  Service: Cardiovascular;  Laterality: N/A;      Social History:  reports that he has been smoking. He has never used smokeless tobacco. He reports current drug use. Drug: Marijuana. He reports that he does not drink alcohol. Family History:  Family History  Problem Relation Age of Onset  . Anemia Mother   . Diabetes Father   . Hypertension Father   . CAD Father        CABG  . Anemia Sister   . Diabetes Paternal Grandmother       HOME MEDICATIONS: Allergies as of 03/15/2020   No Known Allergies     Medication List       Accurate as of Mar 15, 2020 12:43 PM. If you have any questions, ask your nurse or doctor.        Accu-Chek FastClix Lancets Misc USE TO TEST BLOOD SUGAR SEVEN TIMES EVERY DAY   Accu-Chek SmartView test strip Generic drug: glucose blood USE AS DIRECTED TO CHEEK BLOOD SUGAR SIX TIMES DAILY   acetone (urine) test strip Check ketones per protocol   BD Pen Needle Nano U/F 32G X 4 MM Misc Generic drug: Insulin Pen Needle USE TO INJECT INSULIN SIX TIMES DAILY   glucagon 1 MG injection Use  for Severe Hypoglycemia . Inject 1 mg intramuscularly if unresponsive, unable to swallow, unconscious and/or has seizure   Lantus SoloStar 100 UNIT/ML Solostar Pen Generic drug: insulin glargine Inject 25 Units into the skin daily.   levothyroxine 100 MCG tablet Commonly known as: SYNTHROID Take 1 tablet (100 mcg total) by mouth daily at 6 (six) AM.   NovoLOG FlexPen 100 UNIT/ML FlexPen Generic drug: insulin aspart 0-15 Units, Subcutaneous, 3 times daily with meals CBG < 70: implement hypoglycemia protocol-call MD CBG 70 - 120: 0 units CBG 121 - 150: 2 units CBG 151 - 200: 3 units CBG 201 - 250: 5 units CBG 251 - 300: 8 units CBG 301 - 350: 11 units CBG 351 - 400: 15 units CBG > 400:         ALLERGIES: No Known Allergies   REVIEW OF SYSTEMS: A comprehensive ROS was conducted with the patient and is negative except as per HPI and below:  ROS    OBJECTIVE:   VITAL SIGNS: There were no vitals taken for this visit.   PHYSICAL EXAM:  General: Pt appears well and is in NAD  Hydration: Well-hydrated with moist mucous membranes and good skin turgor  HEENT: Head: Unremarkable with good dentition. Oropharynx clear without exudate.  Eyes: External eye exam normal without stare, lid lag or exophthalmos.  EOM intact.  PERRL.  Neck: General: Supple without adenopathy or carotid bruits. Thyroid: Thyroid size normal.  No goiter or nodules appreciated. No thyroid bruit.  Lungs: Clear with good BS bilat with no rales, rhonchi, or wheezes  Heart: RRR with normal S1 and S2 and no gallops; no murmurs; no rub  Abdomen: Normoactive bowel sounds, soft, nontender, without masses or organomegaly palpable  Extremities:  Lower extremities - No pretibial edema. No lesions.  Skin: Normal texture and temperature to palpation. No rash noted. No Acanthosis nigricans/skin tags. No lipohypertrophy.  Neuro: MS is good with appropriate affect, pt is alert and Ox3    DM foot exam:    DATA REVIEWED:  Lab Results  Component Value Date   HGBA1C 10.5 (H) 12/27/2019   HGBA1C 10.1 09/18/2015   HGBA1C 10.6 (H) 03/06/2015   Lab Results  Component Value Date   MICROALBUR 0.50 04/04/2014   LDLCALC 122 (H) 04/04/2014   CREATININE 0.82 12/28/2019   Lab Results  Component Value Date   MICRALBCREAT 7.0 04/04/2014    Lab Results  Component Value Date   CHOL 188 (H) 04/04/2014   HDL 35 04/04/2014   LDLCALC 122 (H) 04/04/2014   TRIG 154 (H) 04/04/2014   CHOLHDL 5.4 04/04/2014        ASSESSMENT / PLAN / RECOMMENDATIONS:   1) Type *** Diabetes Mellitus, ***controlled, With*** complications - Most recent A1c of *** %. Goal A1c < *** %.   ***  Plan: GENERAL:  ***  MEDICATIONS:  ***  EDUCATION / INSTRUCTIONS:  BG monitoring instructions: Patient is instructed to check his blood sugars *** times a day, ***.  Call [place] [facility] if: BG persistently < 70 or > 300. . I reviewed the Rule of 15 for the treatment of hypoglycemia in detail with the patient. Literature supplied.   2) Diabetic complications:   Eye: Does *** have known diabetic retinopathy.   Neuro/ Feet: Does *** have known diabetic peripheral neuropathy.  Renal: Patient does *** have known baseline CKD. He is *** on an ACEI/ARB at present.Check urine albumin/creatinine ratio yearly starting at time of diagnosis. If albuminuria is positive,  treatment is geared toward better glucose, blood pressure control and use of ACE inhibitors or ARBs. Monitor electrolytes and creatinine once to twice yearly.   3) Lipids: Patient is *** on a statin.    4) Hypertension: ***  at goal of < 140/90 mmHg.       Signed electronically by: Lyndle Herrlich, MD  [facility]  [facility] [address] [place], [place] [address] Phone: [phone] FAX: [phone]   CC: No primary care provider on file. No primary provider on file. Phone: None  Fax: None    Return to Endocrinology clinic as below: Future Appointments  Date Time Provider Department Center  03/15/2020  2:40 PM Sheranda Seabrooks, Konrad Dolores, MD [facility] None

## 2020-05-01 ENCOUNTER — Encounter: Payer: Self-pay | Admitting: Internal Medicine

## 2020-05-01 ENCOUNTER — Ambulatory Visit: Payer: 59 | Admitting: Internal Medicine

## 2020-05-01 ENCOUNTER — Ambulatory Visit (INDEPENDENT_AMBULATORY_CARE_PROVIDER_SITE_OTHER): Payer: 59 | Admitting: Internal Medicine

## 2020-05-01 ENCOUNTER — Other Ambulatory Visit: Payer: Self-pay

## 2020-05-01 VITALS — BP 118/68 | HR 58 | Ht 67.0 in | Wt 142.2 lb

## 2020-05-01 DIAGNOSIS — E1065 Type 1 diabetes mellitus with hyperglycemia: Secondary | ICD-10-CM

## 2020-05-01 DIAGNOSIS — E063 Autoimmune thyroiditis: Secondary | ICD-10-CM | POA: Diagnosis not present

## 2020-05-01 DIAGNOSIS — E11649 Type 2 diabetes mellitus with hypoglycemia without coma: Secondary | ICD-10-CM | POA: Diagnosis not present

## 2020-05-01 DIAGNOSIS — E038 Other specified hypothyroidism: Secondary | ICD-10-CM

## 2020-05-01 LAB — MICROALBUMIN / CREATININE URINE RATIO
Creatinine,U: 99.4 mg/dL
Microalb Creat Ratio: 0.7 mg/g (ref 0.0–30.0)
Microalb, Ur: <0.7 mg/dL (ref 0.0–1.9)

## 2020-05-01 LAB — COMPREHENSIVE METABOLIC PANEL
ALT: 11 U/L (ref 0–53)
AST: 12 U/L (ref 0–37)
Albumin: 4.4 g/dL (ref 3.5–5.2)
Alkaline Phosphatase: 54 U/L (ref 39–117)
BUN: 14 mg/dL (ref 6–23)
CO2: 32 mEq/L (ref 19–32)
Calcium: 9.5 mg/dL (ref 8.4–10.5)
Chloride: 99 mEq/L (ref 96–112)
Creatinine, Ser: 0.87 mg/dL (ref 0.40–1.50)
GFR: 109.29 mL/min (ref >60.00)
Glucose, Bld: 241 mg/dL — ABNORMAL HIGH (ref 70–99)
Potassium: 4.3 mEq/L (ref 3.5–5.1)
Sodium: 136 mEq/L (ref 135–145)
Total Bilirubin: 0.5 mg/dL (ref 0.2–1.2)
Total Protein: 7.1 g/dL (ref 6.0–8.3)

## 2020-05-01 LAB — LIPID PANEL
Cholesterol: 166 mg/dL (ref 0–200)
HDL: 51.2 mg/dL (ref >39.00)
LDL Cholesterol: 101 mg/dL — ABNORMAL HIGH (ref 0–99)
NonHDL: 114.77
Total CHOL/HDL Ratio: 3
Triglycerides: 69 mg/dL (ref 0.0–149.0)
VLDL: 13.8 mg/dL (ref 0.0–40.0)

## 2020-05-01 LAB — POCT GLYCOSYLATED HEMOGLOBIN (HGB A1C): Hemoglobin A1C: 9.5 % — AB (ref 4.0–5.6)

## 2020-05-01 LAB — GLUCOSE, POCT (MANUAL RESULT ENTRY): POC Glucose: 289 mg/dl — AB (ref 70–99)

## 2020-05-01 LAB — T4, FREE: Free T4: 0.79 ng/dL (ref 0.60–1.60)

## 2020-05-01 LAB — TSH: TSH: 17.1 u[IU]/mL — ABNORMAL HIGH (ref 0.35–4.50)

## 2020-05-01 MED ORDER — DEXCOM G6 SENSOR MISC: 1.0000 | 3 refills | Status: AC

## 2020-05-01 MED ORDER — DEXCOM G6 TRANSMITTER MISC: 1.0000 | 3 refills | Status: AC

## 2020-05-01 NOTE — Patient Instructions (Signed)
-   Decrease Lantus to 24 units daily  - Novolog 8 units with each meal  - Novolog correctional insulin: ADD extra units on insulin to your meal-time Novolog dose if your blood sugars are higher than 170. Use the scale below to help guide you:   Blood sugar before meal Number of units to inject  Less than 170 0 unit  171 -  210 1 units  211 -  250 2 units  251 -  290 3 units  291 -  330 4 units  331 -  370 5 units  371 -  410 6 units  411 -  450 7 units  451 -  490 8 units       HOW TO TREAT LOW BLOOD SUGARS (Blood sugar LESS THAN 70 MG/DL)  Please follow the RULE OF 15 for the treatment of hypoglycemia treatment (when your (blood sugars are less than 70 mg/dL)    STEP 1: Take 15 grams of carbohydrates when your blood sugar is low, which includes:   3-4 GLUCOSE TABS  OR  3-4 OZ OF JUICE OR REGULAR SODA OR  ONE TUBE OF GLUCOSE GEL     STEP 2: RECHECK blood sugar in 15 MINUTES STEP 3: If your blood sugar is still low at the 15 minute recheck --> then, go back to STEP 1 and treat AGAIN with another 15 grams of carbohydrates.

## 2020-05-01 NOTE — Progress Notes (Signed)
Name: Blake Sherman  MRN/ DOB: [id number], [birth year]   Age/ Sex: 24 y.o., male    PCP: Patria Mane, MD   Reason for Endocrinology Evaluation: Type 1 Diabetes Mellitus     Date of Initial Endocrinology Visit: 05/01/2020     PATIENT IDENTIFIER: Mr. Blake Sherman is a 24 y.o. male with a past medical history of T1DM and Hypothyroidism. The patient presented for initial endocrinology clinic visit on 05/01/2020 for consultative assistance with his diabetes management.    HPI: Mr. Blake Sherman was    Diagnosed with T1DM  In 04/2013 Prior Medications tried/Intolerance: Was on the pump up until 2 months ago ( T-slim)  Currently checking blood sugars 0 x / day  Hypoglycemia episodes : Yes            Symptoms: Yes               Frequency: 1/ month Hemoglobin A1c has ranged from 7.5% in 2015, peaking at  10.6% in 2016. Patient required assistance for hypoglycemia: yes- 2 months ago  Patient has required hospitalization within the last 1 year from hyper or hypoglycemia: Yes in 12/2019 with DKA   In terms of diet, the patient eats 3 meals a day , does not snack. Drinks juice and soda    THYROID HISTORY: He was diagnosed with hypothyroidism in 2014 during admission for new onset hyperglycemia and was found to have a TSH of 70.0 uIU/mL . He was started on LT- 4 replacement at the time.     Works at a [employer] DIABETES REGIMEN: Lantus 26 units units  Novolog 3-5 units Synthroid 100 mcg daily    Statin: no ACE-I/ARB: no Prior Diabetic Education: yes  METER DOWNLOAD SUMMARY: Did not bring    DIABETIC COMPLICATIONS: Microvascular complications:    Denies: CKD, retinopathy, neuropathy   Last eye exam: Completed a while ago   Macrovascular complications:    Denies: CAD, PVD, CVA   PAST HISTORY: Past Medical History:  Past Medical History:  Diagnosis Date  . Diabetes mellitus without complication [facility])    diagnosed July 2014  . Hypothyroidism    Past Surgical  History:  Past Surgical History:  Procedure Laterality Date  . LEFT HEART CATH AND CORONARY ANGIOGRAPHY N/A 12/28/2019   Procedure: LEFT HEART CATH AND CORONARY ANGIOGRAPHY;  Surgeon: Corky Crafts, MD;  Location: [facility] INVASIVE CV LAB;  Service: Cardiovascular;  Laterality: N/A;      Social History:  reports that he has been smoking. He has never used smokeless tobacco. He reports current drug use. Drug: Marijuana. He reports that he does not drink alcohol. Family History:  Family History  Problem Relation Age of Onset  . Anemia Mother   . Diabetes Father   . Hypertension Father   . CAD Father        CABG  . Anemia Sister   . Diabetes Paternal Grandmother      HOME MEDICATIONS: Allergies as of 05/01/2020   No Known Allergies     Medication List       Accurate as of May 01, 2020  1:09 PM. If you have any questions, ask your nurse or doctor.        Accu-Chek FastClix Lancets Misc USE TO TEST BLOOD SUGAR SEVEN TIMES EVERY DAY   Accu-Chek SmartView test strip Generic drug: glucose blood USE AS DIRECTED TO CHEEK BLOOD SUGAR SIX TIMES DAILY   acetone (urine) test strip Check ketones per protocol  BD Pen Needle Nano U/F 32G X 4 MM Misc Generic drug: Insulin Pen Needle USE TO INJECT INSULIN SIX TIMES DAILY   glucagon 1 MG injection Use for Severe Hypoglycemia . Inject 1 mg intramuscularly if unresponsive, unable to swallow, unconscious and/or has seizure   Lantus SoloStar 100 UNIT/ML Solostar Pen Generic drug: insulin glargine Inject 25 Units into the skin daily. What changed: how much to take   levothyroxine 100 MCG tablet Commonly known as: SYNTHROID Take 1 tablet (100 mcg total) by mouth daily at 6 (six) AM.   NovoLOG FlexPen 100 UNIT/ML FlexPen Generic drug: insulin aspart 0-15 Units, Subcutaneous, 3 times daily with meals CBG < 70: implement hypoglycemia protocol-call MD CBG 70 - 120: 0 units CBG 121 - 150: 2 units CBG 151 - 200: 3 units CBG 201 -  250: 5 units CBG 251 - 300: 8 units CBG 301 - 350: 11 units CBG 351 - 400: 15 units CBG > 400:        ALLERGIES: No Known Allergies   REVIEW OF SYSTEMS: A comprehensive ROS was conducted with the patient and is negative except as per HPI and below:  Review of Systems  Gastrointestinal: Negative for diarrhea and nausea.  Genitourinary: Negative for frequency.  Neurological: Negative for tingling.  Endo/Heme/Allergies: Negative for polydipsia.      OBJECTIVE:   VITAL SIGNS: BP 118/68 (BP Location: Left Arm, Patient Position: Sitting, Cuff Size: Normal)   Pulse (!) 58   Ht 5\' 7"  (1.702 m)   Wt 142 lb 3.2 oz (64.5 kg)   SpO2 98%   BMI 22.27 kg/m    PHYSICAL EXAM:  General: Pt appears well and is in NAD  HEENT:  Eyes: External eye exam normal without stare, lid lag or exophthalmos.  EOM intact.   Neck: General: Supple without adenopathy or carotid bruits. Thyroid: Thyroid size normal.  No goiter or nodules appreciated. No thyroid bruit.  Lungs: Clear with good BS bilat with no rales, rhonchi, or wheezes  Heart: RRR with normal S1 and S2 and no gallops; no murmurs; no rub  Abdomen: Normoactive bowel sounds, soft, nontender, without masses or organomegaly palpable  Extremities:  Lower extremities - No pretibial edema. No lesions.  Skin: Normal texture and temperature to palpation  Neuro: MS is good with appropriate affect, pt is alert and Ox3    DM foot exam: 05/01/2020  The skin of the feet is intact without sores or ulcerations. The pedal pulses are 2+ on right and 2+ on left. The sensation is intact to a screening 5.07, 10 gram monofilament bilaterally   DATA REVIEWED:  Lab Results  Component Value Date   HGBA1C 9.5 (A) 05/01/2020   HGBA1C 10.5 (H) 12/27/2019   HGBA1C 10.1 09/18/2015  Results for Blake Sherman, Blake Sherman (MRN Jacob Moores) as of 05/02/2020 18:43  Ref. Range 05/01/2020 13:38  Sodium Latest Ref Range: 135 - 145 mEq/L 136  Potassium Latest Ref Range:  3.5 - 5.1 mEq/L 4.3  Chloride Latest Ref Range: 96 - 112 mEq/L 99  CO2 Latest Ref Range: 19 - 32 mEq/L 32  Glucose Latest Ref Range: 70 - 99 mg/dL 07/02/2020 (H)  BUN Latest Ref Range: 6 - 23 mg/dL 14  Creatinine Latest Ref Range: 0.40 - 1.50 mg/dL 782  Calcium Latest Ref Range: 8.4 - 10.5 mg/dL 9.5  Alkaline Phosphatase Latest Ref Range: 39 - 117 U/L 54  Albumin Latest Ref Range: 3.5 - 5.2 g/dL 4.4  AST Latest Ref Range: 0 -  37 U/L 12  ALT Latest Ref Range: 0 - 53 U/L 11  Total Protein Latest Ref Range: 6.0 - 8.3 g/dL 7.1  Total Bilirubin Latest Ref Range: 0.2 - 1.2 mg/dL 0.5  GFR Latest Ref Range: >60.00 mL/min 109.29  Total CHOL/HDL Ratio Unknown 3  Cholesterol Latest Ref Range: 0 - 200 mg/dL 680  HDL Cholesterol Latest Ref Range: >39.00 mg/dL 32.12  LDL (calc) Latest Ref Range: 0 - 99 mg/dL 248 (H)  MICROALB/CREAT RATIO Latest Ref Range: 0.0 - 30.0 mg/g 0.7  NonHDL Unknown 114.77  Triglycerides Latest Ref Range: 0 - 149 mg/dL 25.0  VLDL Latest Ref Range: 0.0 - 40.0 mg/dL 03.7  TSH Latest Ref Range: 0.35 - 4.50 uIU/mL 17.10 (H)  T4,Free(Direct) Latest Ref Range: 0.60 - 1.60 ng/dL 0.48  Creatinine,U Latest Units: mg/dL 88.9  Microalb, Ur Latest Ref Range: 0.0 - 1.9 mg/dL <1.6            ASSESSMENT / PLAN / RECOMMENDATIONS:   1) Type 1 Diabetes Mellitus, Poorly controlled, Without complications - Most recent A1c of 9.5 %. Goal A1c < 7.0 %.     I have discussed with the patient the pathophysiology of diabetes. We went over the natural progression of the disease. We talked about both insulin resistance and insulin deficiency. We stressed the importance of lifestyle changes including diet and exercise. I explained the complications associated with diabetes including retinopathy, nephropathy, neuropathy as well as increased risk of cardiovascular disease. We went over the benefit seen with glycemic control.   I explained to the patient that diabetic patients are at higher than normal  risk for amputations.   Discussed pharmacokinetics of basal/bolus insulin and the importance of taking prandial insulin with meals.   We also discussed avoiding sugar-sweetened beverages and snacks, when possible.   MEDICATIONS: - Decrease Lantus to 24 units daily  - Novolog 8 units with each meal  - CF: Novolog ( BG -130/40)   EDUCATION / INSTRUCTIONS:  BG monitoring instructions: Patient is instructed to check his blood sugars 4 times a day, before meals and bedtime   Call [facility] if: BG persistently < 70 . I reviewed the Rule of 15 for the treatment of hypoglycemia in detail with the patient. Literature supplied.   2) Diabetic complications:   Eye: Does not have known diabetic retinopathy.   Neuro/ Feet: Does not have known diabetic peripheral neuropathy.  Renal: Patient does not have known baseline CKD. He is not on an ACEI/ARB at present.Normal MA/Cr ratio   3) Lipids: Lipid panel acceptable. NO indication for statins at this time. He will be eligible at age 59 ( 20 yrs since his diagnosis)   4) Hashimoto's Thyroiditis :   - Pt is clinically euthyroid  - Pt educated extensively on the correct way to take levothyroxine (first thing in the morning with water, 30 minutes before eating or taking other medications). - Pt encouraged to double dose the following day if she were to miss a dose given long half-life of levothyroxine. - He is biochemically hypothyroid, will increase levothyroxine as below   Stop Levothyroxine 100 mcg  Start Levothyroxine 112 mcg daily       F/U 3 months   Signed electronically by: Lyndle Herrlich, MD  [facility] Endocrinology  [facility] [address] [place] [place], [place] [address] Phone: [phone] FAX: [phone]   CC: Patria Mane, MD No address on file Phone: None  Fax: None  Return to Endocrinology clinic as below: No future appointments.

## 2020-05-02 MED ORDER — LEVOTHYROXINE SODIUM 112 MCG PO TABS: 112.0000 ug | ORAL_TABLET | Freq: Every day | ORAL | 3 refills | Status: AC

## 2020-05-06 ENCOUNTER — Other Ambulatory Visit: Payer: Self-pay | Admitting: Internal Medicine

## 2020-05-06 MED ORDER — BD PEN NEEDLE NANO U/F 32G X 4 MM MISC: 3 refills | Status: AC

## 2020-05-06 MED ORDER — GLUCAGON 3 MG/DOSE NA POWD: 3.0000 mg | Freq: Once | NASAL | 11 refills | Status: DC | PRN

## 2020-05-06 MED ORDER — NOVOLOG FLEXPEN 100 UNIT/ML ~~LOC~~ SOPN: PEN_INJECTOR | SUBCUTANEOUS | 1 refills | Status: DC

## 2020-05-06 MED ORDER — LANTUS SOLOSTAR 100 UNIT/ML ~~LOC~~ SOPN: 24.0000 [IU] | PEN_INJECTOR | Freq: Every day | SUBCUTANEOUS | 1 refills | Status: DC

## 2020-05-13 ENCOUNTER — Other Ambulatory Visit: Payer: Self-pay

## 2020-05-13 MED ORDER — BAQSIMI ONE PACK 3 MG/DOSE NA POWD: 1.0000 | NASAL | 1 refills | Status: AC

## 2020-12-10 ENCOUNTER — Other Ambulatory Visit: Payer: Self-pay | Admitting: Internal Medicine

## 2020-12-10 ENCOUNTER — Other Ambulatory Visit: Payer: Self-pay

## 2020-12-10 DIAGNOSIS — E11649 Type 2 diabetes mellitus with hypoglycemia without coma: Secondary | ICD-10-CM

## 2020-12-10 MED ORDER — LANTUS SOLOSTAR 100 UNIT/ML ~~LOC~~ SOPN: 24.0000 [IU] | PEN_INJECTOR | Freq: Every day | SUBCUTANEOUS | 0 refills | Status: DC

## 2020-12-10 MED ORDER — NOVOLOG FLEXPEN 100 UNIT/ML ~~LOC~~ SOPN: PEN_INJECTOR | SUBCUTANEOUS | 1 refills | Status: AC

## 2020-12-10 NOTE — Telephone Encounter (Signed)
Inbound call from pt advising refills for insulin needed. Pt ran out of both is long acting and meal time insulin. Refills sent to preferred pharmacy.

## 2021-02-15 IMAGING — CT CT ANGIO CHEST
3 of 6 series · 15 of 36 positions shown · IV contrast (omnipaque)
Comparison: None. No prior chest imaging.

CLINICAL DATA: PE suspected, high prob chest pain, sob, elevated
troponins

EXAM:
CT ANGIOGRAPHY CHEST WITH CONTRAST
TECHNIQUE: Multidetector CT imaging of the chest was performed using the
standard protocol during bolus administration of intravenous
contrast. Multiplanar CT image reconstructions and MIPs were
obtained to evaluate the vascular anatomy.
CONTRAST:  100mL OMNIPAQUE IOHEXOL 350 MG/ML SOLN

[Series 5: pe 2mm · axial · 0.60mm/px · z∈[-225,-21]mm · 6 of 144 slices shown]
[im 21/144  lung]
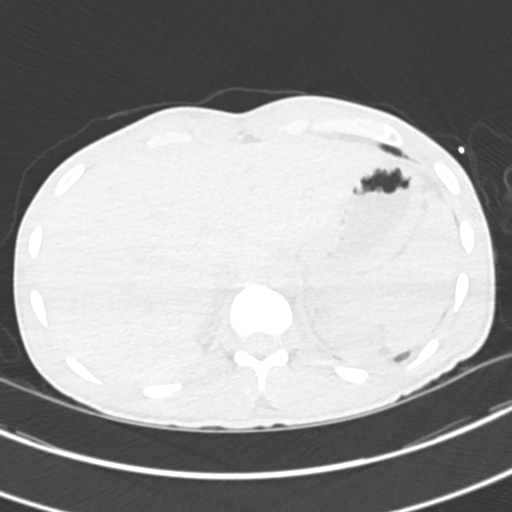
[im 41/144  mediastinal]
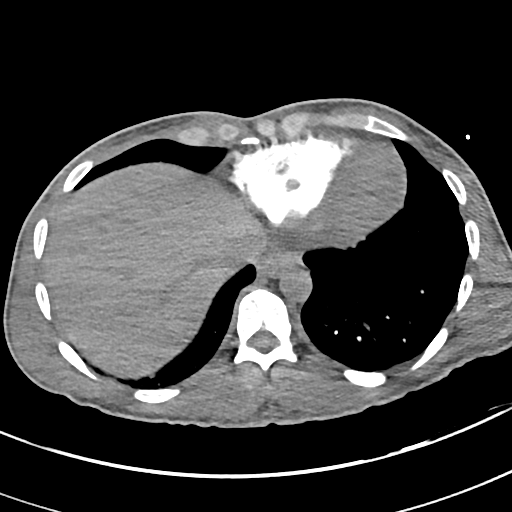
[im 62/144  lung]
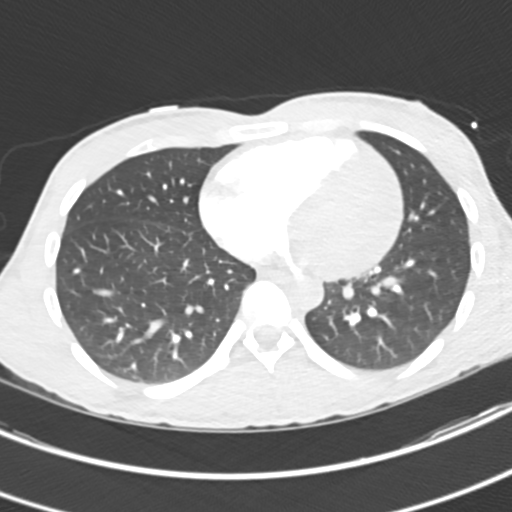
[im 82/144  mediastinal]
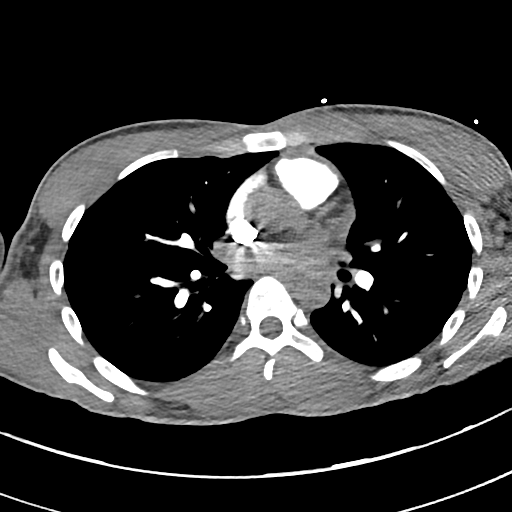
[im 103/144  lung]
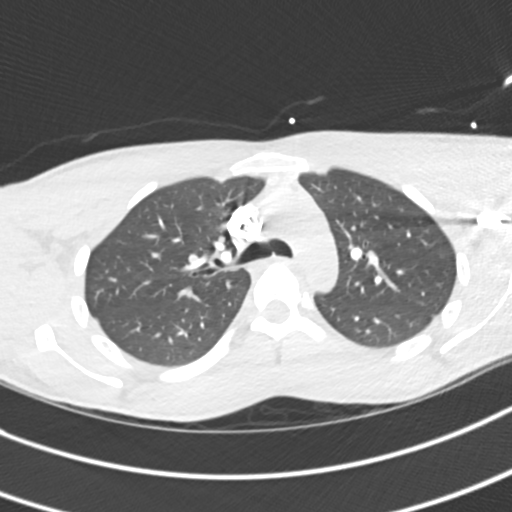
[im 123/144  mediastinal]
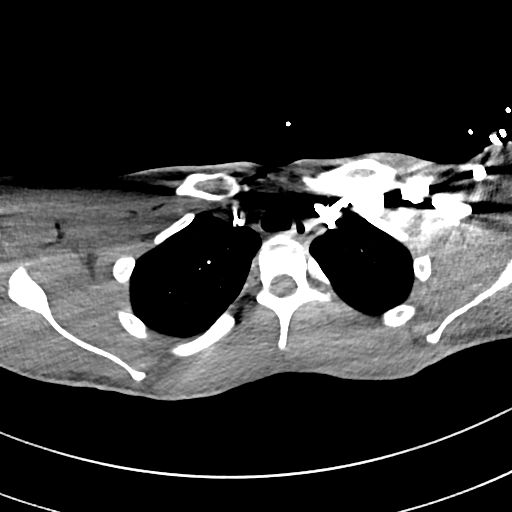

[Series 7: pe thins · axial · 0.60mm/px · z∈[-202,-6]mm · 8 of 357 slices shown]
[im 38/357  lung]
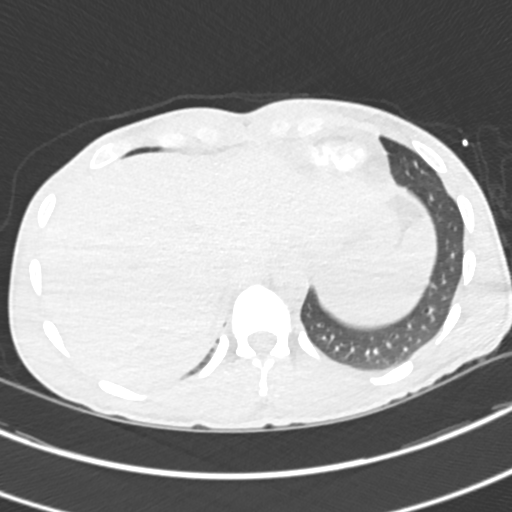
[im 75/357  lung]
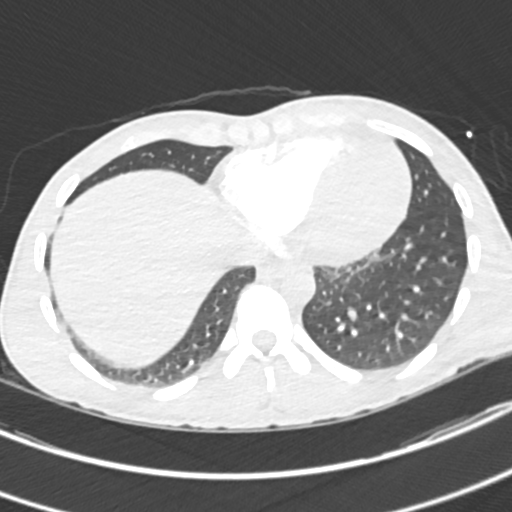
[im 113/357  lung]
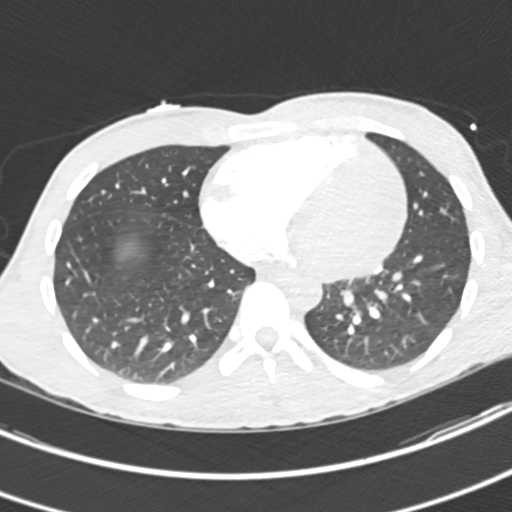
[im 150/357  lung]
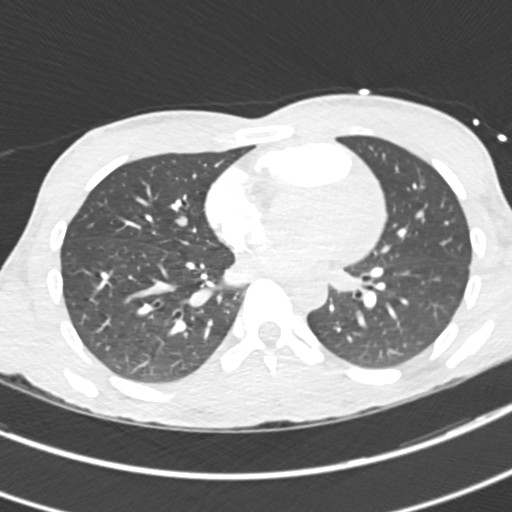
[im 207/357  lung]
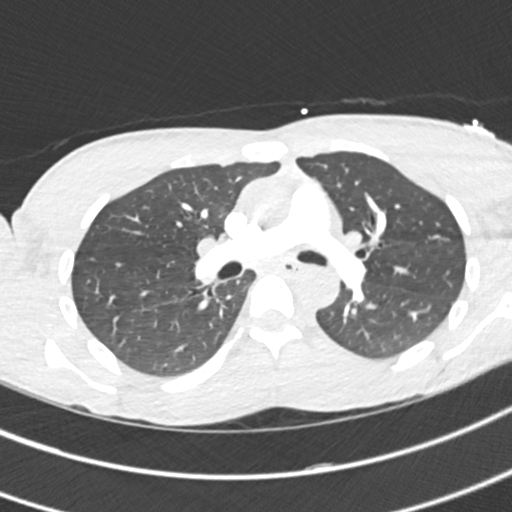
[im 244/357  lung]
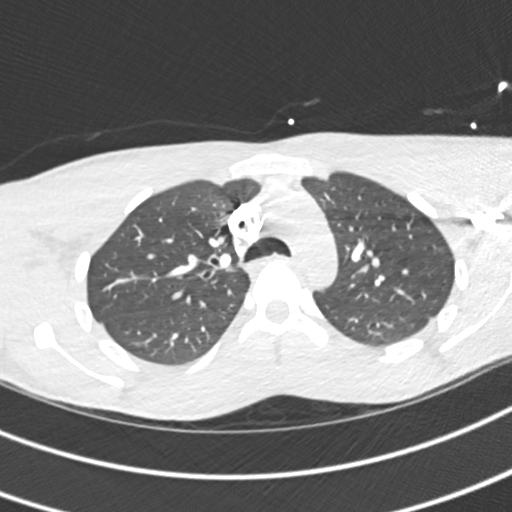
[im 282/357  lung]
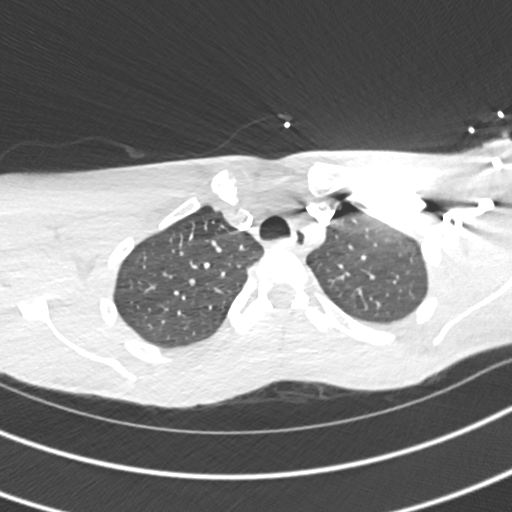
[im 319/357  lung]
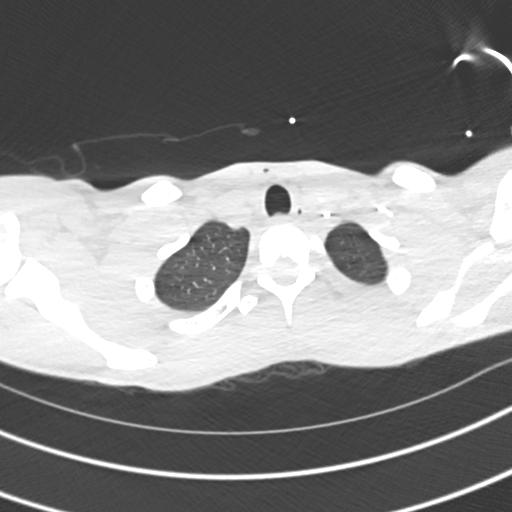

[Series 8: pe 2mm cor · coronal · 0.59mm/px · 1 of 121 slices shown]
[im 61/121  mediastinal]
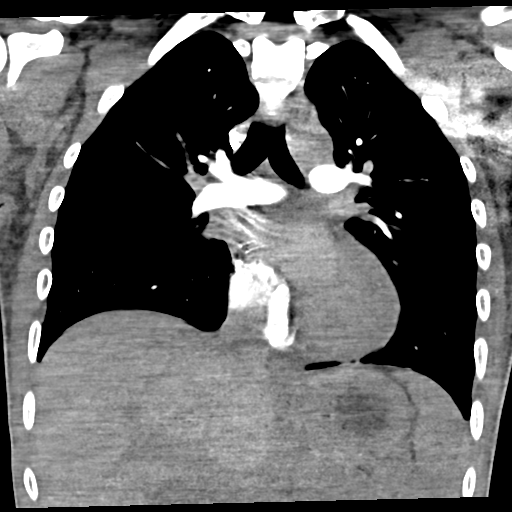

[15 of 36 positions shown; findings below may reference images not displayed]

FINDINGS: Cardiovascular: There are no filling defects within the pulmonary
arteries to suggest pulmonary embolus. The thoracic aorta is normal
in caliber. Cannot assess for dissection given phase of contrast
tailored to pulmonary artery evaluation. Heart is normal in size. No
pericardial effusion. Scattered linear air in the anterior neck felt
to be related to IV catheter placement.

Mediastinum/Nodes: No adenopathy, detailed evaluation limited given
paucity of mediastinal fat. No esophageal wall thickening. No
visualized thyroid nodule.

Lungs/Pleura: Mild motion artifact in the lower lobes. Minimal
dependent atelectasis at the lung bases. The lungs are otherwise
clear. No pleural fluid. No pulmonary edema. Trachea and mainstem
bronchi are patent.

Upper Abdomen: Excreted IV contrast in both renal collecting
systems.

Musculoskeletal: There are no acute or suspicious osseous
abnormalities. Slight endplate spurring in the upper thoracic spine
with minimal loss of height of superior endplate of T4, likely
chronic.

Review of the MIP images confirms the above findings.
IMPRESSION: No pulmonary embolus or acute intrathoracic abnormality.

## 2021-03-16 ENCOUNTER — Other Ambulatory Visit: Payer: Self-pay | Admitting: Internal Medicine

## 2021-03-16 DIAGNOSIS — E11649 Type 2 diabetes mellitus with hypoglycemia without coma: Secondary | ICD-10-CM

## 2021-08-27 ENCOUNTER — Other Ambulatory Visit: Payer: Self-pay | Admitting: Internal Medicine
